# Patient Record
Sex: Female | Born: 1960 | Race: White | Hispanic: No | Marital: Married
Health system: Southern US, Community
[De-identification: ages and names within clinical notes are randomized; demographics above are authoritative.]

## PROBLEM LIST (undated history)

## (undated) DIAGNOSIS — E119 Type 2 diabetes mellitus without complications: Secondary | ICD-10-CM

## (undated) DIAGNOSIS — E079 Disorder of thyroid, unspecified: Secondary | ICD-10-CM

## (undated) DIAGNOSIS — K219 Gastro-esophageal reflux disease without esophagitis: Secondary | ICD-10-CM

---

## 2019-02-02 ENCOUNTER — Emergency Department (HOSPITAL_COMMUNITY): Payer: BC Managed Care – PPO

## 2019-02-02 ENCOUNTER — Other Ambulatory Visit: Payer: Self-pay

## 2019-02-02 ENCOUNTER — Encounter (HOSPITAL_COMMUNITY): Payer: Self-pay | Admitting: Emergency Medicine

## 2019-02-02 ENCOUNTER — Inpatient Hospital Stay (HOSPITAL_COMMUNITY)
Admission: EM | Admit: 2019-02-02 | Discharge: 2019-02-28 | DRG: 025 | Disposition: E | Payer: BC Managed Care – PPO | Attending: Emergency Medicine | Admitting: Emergency Medicine

## 2019-02-02 DIAGNOSIS — E875 Hyperkalemia: Secondary | ICD-10-CM | POA: Diagnosis not present

## 2019-02-02 DIAGNOSIS — Z781 Physical restraint status: Secondary | ICD-10-CM

## 2019-02-02 DIAGNOSIS — K760 Fatty (change of) liver, not elsewhere classified: Secondary | ICD-10-CM | POA: Diagnosis present

## 2019-02-02 DIAGNOSIS — Z978 Presence of other specified devices: Secondary | ICD-10-CM

## 2019-02-02 DIAGNOSIS — R17 Unspecified jaundice: Secondary | ICD-10-CM

## 2019-02-02 DIAGNOSIS — J189 Pneumonia, unspecified organism: Secondary | ICD-10-CM | POA: Diagnosis present

## 2019-02-02 DIAGNOSIS — R402362 Coma scale, best motor response, obeys commands, at arrival to emergency department: Secondary | ICD-10-CM | POA: Diagnosis present

## 2019-02-02 DIAGNOSIS — E872 Acidosis: Secondary | ICD-10-CM | POA: Diagnosis present

## 2019-02-02 DIAGNOSIS — N390 Urinary tract infection, site not specified: Secondary | ICD-10-CM | POA: Diagnosis not present

## 2019-02-02 DIAGNOSIS — E8770 Fluid overload, unspecified: Secondary | ICD-10-CM | POA: Diagnosis present

## 2019-02-02 DIAGNOSIS — R410 Disorientation, unspecified: Secondary | ICD-10-CM | POA: Diagnosis not present

## 2019-02-02 DIAGNOSIS — Z452 Encounter for adjustment and management of vascular access device: Secondary | ICD-10-CM

## 2019-02-02 DIAGNOSIS — D6959 Other secondary thrombocytopenia: Secondary | ICD-10-CM | POA: Diagnosis present

## 2019-02-02 DIAGNOSIS — E876 Hypokalemia: Secondary | ICD-10-CM | POA: Diagnosis present

## 2019-02-02 DIAGNOSIS — J9600 Acute respiratory failure, unspecified whether with hypoxia or hypercapnia: Secondary | ICD-10-CM

## 2019-02-02 DIAGNOSIS — Z66 Do not resuscitate: Secondary | ICD-10-CM | POA: Diagnosis not present

## 2019-02-02 DIAGNOSIS — T82594A Other mechanical complication of infusion catheter, initial encounter: Secondary | ICD-10-CM

## 2019-02-02 DIAGNOSIS — E871 Hypo-osmolality and hyponatremia: Secondary | ICD-10-CM

## 2019-02-02 DIAGNOSIS — S065X0A Traumatic subdural hemorrhage without loss of consciousness, initial encounter: Principal | ICD-10-CM | POA: Diagnosis present

## 2019-02-02 DIAGNOSIS — A419 Sepsis, unspecified organism: Secondary | ICD-10-CM | POA: Diagnosis not present

## 2019-02-02 DIAGNOSIS — Z9889 Other specified postprocedural states: Secondary | ICD-10-CM

## 2019-02-02 DIAGNOSIS — E039 Hypothyroidism, unspecified: Secondary | ICD-10-CM | POA: Diagnosis present

## 2019-02-02 DIAGNOSIS — Y92009 Unspecified place in unspecified non-institutional (private) residence as the place of occurrence of the external cause: Secondary | ICD-10-CM

## 2019-02-02 DIAGNOSIS — R4182 Altered mental status, unspecified: Secondary | ICD-10-CM | POA: Diagnosis not present

## 2019-02-02 DIAGNOSIS — E861 Hypovolemia: Secondary | ICD-10-CM | POA: Diagnosis not present

## 2019-02-02 DIAGNOSIS — B965 Pseudomonas (aeruginosa) (mallei) (pseudomallei) as the cause of diseases classified elsewhere: Secondary | ICD-10-CM | POA: Diagnosis not present

## 2019-02-02 DIAGNOSIS — L89152 Pressure ulcer of sacral region, stage 2: Secondary | ICD-10-CM | POA: Diagnosis present

## 2019-02-02 DIAGNOSIS — N179 Acute kidney failure, unspecified: Secondary | ICD-10-CM | POA: Diagnosis not present

## 2019-02-02 DIAGNOSIS — R748 Abnormal levels of other serum enzymes: Secondary | ICD-10-CM

## 2019-02-02 DIAGNOSIS — G9341 Metabolic encephalopathy: Secondary | ICD-10-CM | POA: Diagnosis present

## 2019-02-02 DIAGNOSIS — K219 Gastro-esophageal reflux disease without esophagitis: Secondary | ICD-10-CM | POA: Diagnosis present

## 2019-02-02 DIAGNOSIS — K7200 Acute and subacute hepatic failure without coma: Secondary | ICD-10-CM

## 2019-02-02 DIAGNOSIS — J9601 Acute respiratory failure with hypoxia: Secondary | ICD-10-CM

## 2019-02-02 DIAGNOSIS — D6489 Other specified anemias: Secondary | ICD-10-CM | POA: Diagnosis present

## 2019-02-02 DIAGNOSIS — G8191 Hemiplegia, unspecified affecting right dominant side: Secondary | ICD-10-CM | POA: Diagnosis present

## 2019-02-02 DIAGNOSIS — R4701 Aphasia: Secondary | ICD-10-CM | POA: Diagnosis present

## 2019-02-02 DIAGNOSIS — S065X9A Traumatic subdural hemorrhage with loss of consciousness of unspecified duration, initial encounter: Secondary | ICD-10-CM | POA: Diagnosis not present

## 2019-02-02 DIAGNOSIS — G9340 Encephalopathy, unspecified: Secondary | ICD-10-CM | POA: Diagnosis present

## 2019-02-02 DIAGNOSIS — E11649 Type 2 diabetes mellitus with hypoglycemia without coma: Secondary | ICD-10-CM | POA: Diagnosis not present

## 2019-02-02 DIAGNOSIS — D689 Coagulation defect, unspecified: Secondary | ICD-10-CM | POA: Diagnosis present

## 2019-02-02 DIAGNOSIS — K831 Obstruction of bile duct: Secondary | ICD-10-CM | POA: Diagnosis present

## 2019-02-02 DIAGNOSIS — E119 Type 2 diabetes mellitus without complications: Secondary | ICD-10-CM

## 2019-02-02 DIAGNOSIS — R6521 Severe sepsis with septic shock: Secondary | ICD-10-CM | POA: Diagnosis not present

## 2019-02-02 DIAGNOSIS — R296 Repeated falls: Secondary | ICD-10-CM | POA: Diagnosis present

## 2019-02-02 DIAGNOSIS — D696 Thrombocytopenia, unspecified: Secondary | ICD-10-CM | POA: Diagnosis not present

## 2019-02-02 DIAGNOSIS — S065XAA Traumatic subdural hemorrhage with loss of consciousness status unknown, initial encounter: Secondary | ICD-10-CM

## 2019-02-02 DIAGNOSIS — R402242 Coma scale, best verbal response, confused conversation, at arrival to emergency department: Secondary | ICD-10-CM | POA: Diagnosis present

## 2019-02-02 DIAGNOSIS — Z20828 Contact with and (suspected) exposure to other viral communicable diseases: Secondary | ICD-10-CM | POA: Diagnosis present

## 2019-02-02 DIAGNOSIS — R402142 Coma scale, eyes open, spontaneous, at arrival to emergency department: Secondary | ICD-10-CM | POA: Diagnosis present

## 2019-02-02 DIAGNOSIS — N170 Acute kidney failure with tubular necrosis: Secondary | ICD-10-CM | POA: Diagnosis present

## 2019-02-02 DIAGNOSIS — Z6836 Body mass index (BMI) 36.0-36.9, adult: Secondary | ICD-10-CM

## 2019-02-02 DIAGNOSIS — W19XXXA Unspecified fall, initial encounter: Secondary | ICD-10-CM | POA: Diagnosis present

## 2019-02-02 HISTORY — DX: Type 2 diabetes mellitus without complications: E11.9

## 2019-02-02 HISTORY — DX: Disorder of thyroid, unspecified: E07.9

## 2019-02-02 HISTORY — DX: Gastro-esophageal reflux disease without esophagitis: K21.9

## 2019-02-02 LAB — COMPREHENSIVE METABOLIC PANEL
ALT: 56 U/L — ABNORMAL HIGH (ref 0–44)
AST: 173 U/L — ABNORMAL HIGH (ref 15–41)
Albumin: 2.2 g/dL — ABNORMAL LOW (ref 3.5–5.0)
Alkaline Phosphatase: 241 U/L — ABNORMAL HIGH (ref 38–126)
Anion gap: 14 (ref 5–15)
BUN: 13 mg/dL (ref 6–20)
CO2: 23 mmol/L (ref 22–32)
Calcium: 8.4 mg/dL — ABNORMAL LOW (ref 8.9–10.3)
Chloride: 79 mmol/L — ABNORMAL LOW (ref 98–111)
Creatinine, Ser: 1.26 mg/dL — ABNORMAL HIGH (ref 0.44–1.00)
GFR calc Af Amer: 56 mL/min — ABNORMAL LOW (ref >60)
GFR calc non Af Amer: 48 mL/min — ABNORMAL LOW (ref >60)
Glucose, Bld: 115 mg/dL — ABNORMAL HIGH (ref 70–99)
Potassium: 3.3 mmol/L — ABNORMAL LOW (ref 3.5–5.1)
Sodium: 116 mmol/L — CL (ref 135–145)
Total Bilirubin: 31.4 mg/dL (ref 0.3–1.2)
Total Protein: 5.6 g/dL — ABNORMAL LOW (ref 6.5–8.1)

## 2019-02-02 LAB — PROTIME-INR
INR: 1.4 — ABNORMAL HIGH (ref 0.8–1.2)
Prothrombin Time: 16.7 seconds — ABNORMAL HIGH (ref 11.4–15.2)

## 2019-02-02 LAB — POCT I-STAT EG7
Acid-base deficit: 1 mmol/L (ref 0.0–2.0)
Bicarbonate: 23.4 mmol/L (ref 20.0–28.0)
Calcium, Ion: 1 mmol/L — ABNORMAL LOW (ref 1.15–1.40)
HCT: 53 % — ABNORMAL HIGH (ref 36.0–46.0)
Hemoglobin: 18 g/dL — ABNORMAL HIGH (ref 12.0–15.0)
O2 Saturation: 62 %
Potassium: 3.3 mmol/L — ABNORMAL LOW (ref 3.5–5.1)
Sodium: 115 mmol/L — CL (ref 135–145)
TCO2: 24 mmol/L (ref 22–32)
pCO2, Ven: 37.1 mmHg — ABNORMAL LOW (ref 44.0–60.0)
pH, Ven: 7.407 (ref 7.250–7.430)
pO2, Ven: 32 mmHg (ref 32.0–45.0)

## 2019-02-02 LAB — RAPID URINE DRUG SCREEN, HOSP PERFORMED
Amphetamines: NOT DETECTED
Barbiturates: NOT DETECTED
Benzodiazepines: NOT DETECTED
Cocaine: NOT DETECTED
Opiates: NOT DETECTED
Tetrahydrocannabinol: NOT DETECTED

## 2019-02-02 LAB — URINALYSIS, ROUTINE W REFLEX MICROSCOPIC
Cellular Cast, UA: 14
Glucose, UA: 50 mg/dL — AB
Ketones, ur: 5 mg/dL — AB
Leukocytes,Ua: NEGATIVE
Nitrite: NEGATIVE
Protein, ur: 30 mg/dL — AB
Specific Gravity, Urine: 1.025 (ref 1.005–1.030)
pH: 5 (ref 5.0–8.0)

## 2019-02-02 LAB — LACTIC ACID, PLASMA
Lactic Acid, Venous: 3 mmol/L (ref 0.5–1.9)
Lactic Acid, Venous: 1.8 mmol/L (ref 0.5–1.9)

## 2019-02-02 LAB — CBC WITH DIFFERENTIAL/PLATELET
Abs Immature Granulocytes: 0.2 10*3/uL — ABNORMAL HIGH (ref 0.00–0.07)
Basophils Absolute: 0 10*3/uL (ref 0.0–0.1)
Basophils Relative: 0 %
Eosinophils Absolute: 0 10*3/uL (ref 0.0–0.5)
Eosinophils Relative: 0 %
HCT: 31.2 % — ABNORMAL LOW (ref 36.0–46.0)
Hemoglobin: 11.1 g/dL — ABNORMAL LOW (ref 12.0–15.0)
Lymphocytes Relative: 6 %
Lymphs Abs: 0.7 10*3/uL (ref 0.7–4.0)
MCH: 33.2 pg (ref 26.0–34.0)
MCHC: 35.6 g/dL (ref 30.0–36.0)
MCV: 93.4 fL (ref 80.0–100.0)
Monocytes Absolute: 0.9 10*3/uL (ref 0.1–1.0)
Monocytes Relative: 8 %
Neutro Abs: 9.4 10*3/uL — ABNORMAL HIGH (ref 1.7–7.7)
Neutrophils Relative %: 84 %
Platelets: 188 10*3/uL (ref 150–400)
Promyelocytes Relative: 2 %
RBC: 3.34 MIL/uL — ABNORMAL LOW (ref 3.87–5.11)
RDW: 14.1 % (ref 11.5–15.5)
WBC: 11.2 10*3/uL — ABNORMAL HIGH (ref 4.0–10.5)
nRBC: 1.2 % — ABNORMAL HIGH (ref 0.0–0.2)
nRBC: 3 /100 WBC — ABNORMAL HIGH

## 2019-02-02 LAB — TYPE AND SCREEN
ABO/RH(D): O POS
Antibody Screen: NEGATIVE

## 2019-02-02 LAB — BILIRUBIN, DIRECT: Bilirubin, Direct: 19.6 mg/dL — ABNORMAL HIGH (ref 0.0–0.2)

## 2019-02-02 LAB — ACETAMINOPHEN LEVEL: Acetaminophen (Tylenol), Serum: <10 ug/mL — ABNORMAL LOW (ref 10–30)

## 2019-02-02 LAB — LIPASE, BLOOD: Lipase: 46 U/L (ref 11–51)

## 2019-02-02 LAB — ETHANOL: Alcohol, Ethyl (B): <10 mg/dL (ref <10)

## 2019-02-02 LAB — TROPONIN I: Troponin I: 0.06 ng/mL (ref <0.03)

## 2019-02-02 LAB — SARS CORONAVIRUS 2 BY RT PCR (HOSPITAL ORDER, PERFORMED IN ~~LOC~~ HOSPITAL LAB): SARS Coronavirus 2: NEGATIVE

## 2019-02-02 LAB — AMMONIA: Ammonia: 68 umol/L — ABNORMAL HIGH (ref 9–35)

## 2019-02-02 MED ORDER — IOHEXOL 300 MG/ML  SOLN
100.0000 mL | Freq: Once | INTRAMUSCULAR | Status: AC | PRN
  Administered 2019-02-02: 100 mL via INTRAVENOUS

## 2019-02-02 MED ORDER — NYSTATIN 100000 UNIT/GM EX POWD
Freq: Three times a day (TID) | CUTANEOUS | Status: DC
  Administered 2019-02-02 – 2019-02-03 (×2): via TOPICAL
  Administered 2019-02-03: 21:00:00 1 g via TOPICAL
  Administered 2019-02-04: 16:00:00 via TOPICAL
  Administered 2019-02-04: 10:00:00 1 via TOPICAL
  Administered 2019-02-04 – 2019-02-13 (×27): via TOPICAL
  Administered 2019-02-13: 1 g via TOPICAL
  Administered 2019-02-14 (×2): via TOPICAL
  Administered 2019-02-14: 21:00:00 1 g via TOPICAL
  Administered 2019-02-15: 09:00:00 via TOPICAL
  Filled 2019-02-02 (×5): qty 15

## 2019-02-02 NOTE — ED Notes (Signed)
[facility] transport line contacted for transport. Will return call when room number has been assigned. Dr.Carson accepting provider, awaiting bed on MICU

## 2019-02-02 NOTE — ED Notes (Signed)
CT contacted for discs for transport

## 2019-02-02 NOTE — ED Triage Notes (Signed)
Patient in via [facility] from home for altered mental status that has progressed since mechanical fall at about 2pm today. Patient completed a telehealth assessment online and went to an imaging center for a back x-ray. Per EMS, patient's daughter had told them she was only c/o R ankle pain. On arrival, patient severely jaundiced head to toe - spoke with daughter, who stated that her skin has been yellow for only 1.5-2 weeks. She also reported to doctor at bedside that patient has been limited to bed for the last 2-3 months and has had episodes of vomiting and abd pain. Patient A&O x 2, answers some questions appropriately but does not talk in complete sentences and appears very lethargic. Resp e/u.   Multiple areas of skin breakdown noted underneath both breasts, posterior legs, and underneath abdomen.

## 2019-02-02 NOTE — Consult Note (Signed)
Reason for Consult: Left subdural hematoma Referring Physician: EDP  Lavina HammanDee Taylor Robinson is an 58 y.o. female.   HPI:  58 year old female who was seen in emergency department with altered mental status and obvious jaundice.  She is unable to cooperate with history and physical.  Very little information is known.  The EDP did speak to her daughter and it appears she has a fairly benign past medical history.  It is unknown how long she has been like this.  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 Diabetes mellitus without complication (HCC)     History reviewed. No pertinent surgical history.  No Known Allergies  Social History   Tobacco Use  . Smoking status: Not on file  Substance Use Topics  . Alcohol use: Not on file    History reviewed. No pertinent family history.   Review of Systems  Positive ROS: Unable to obtain  All other systems have been reviewed and were otherwise negative with the exception of those mentioned in the HPI and as above.  Objective: Vital signs in last 24 hours: Temp:  [98.1 F (36.7 C)] 98.1 F (36.7 C) (06/05 1740) Pulse Rate:  [101-108] 101 (06/05 1900) Resp:  [14-21] 21 (06/05 1900) BP: (121-139)/(52-63) 121/58 (06/05 1900) SpO2:  [95 %-96 %] 95 % (06/05 1900)  General Appearance: Arousable, will some words, weakly localizes on the left but seems somewhat weaker on the right (E3, V2, M5) Head: Normocephalic, without obvious abnormality, atraumatic Eyes: PERRL, conjunctiva/corneas icteric, gaze conjugate Ears: Normal TM's and external ear canals, both ears Throat: benign, facial mask in place Neck: Supple Lungs:  respirations unlabored Heart: Regular rate and rhythm Abdomen: Soft Extremities: Extremities normal, atraumatic, no cyanosis or  edema Pulses: 2+ and symmetric all extremities Skin: Skin color is quite jaundiced, multiple areas of redness and skin breakdown in the folds of her groins and under her breasts  NEUROLOGIC:   Mental status: Resting comfortably but arouses to stimuli and sometimes to voice, localizes briskly on the left but seems somewhat right hemiparetic, not follow commands but mumbles words, suspect she is aphasic Motor Exam -localizes and purposeful on the left and somewhat hemiparetic on the right, normal tone and bulk Sensory Exam -unable to test Reflexes: Not tested Coordination -unable to test Gait -unable to test Balance -unable to test Cranial Nerves: I: smell Not tested  II: visual acuity  OS: na    OD: na  II: visual fields   II: pupils perrl  III,VII: ptosis absent  III,IV,VI: extraocular muscles    V: mastication   V: facial light touch sensation    V,VII: corneal reflex  present  VII: facial muscle function - upper    VII: facial muscle function - lower   VIII: hearing   IX: soft palate elevation    IX,X: gag reflex   XI: trapezius strength    XI: sternocleidomastoid strength   XI: neck flexion strength    XII: tongue strength      Data Review Lab Results  Component Value Date   WBC 11.2 (H) Jan 08, 2019   HGB 18.0 (H) Jan 08, 2019   HCT 53.0 (H) Jan 08, 2019   MCV 93.4 Jan 08, 2019   PLT 188 Jan 08, 2019   Lab Results  Component Value Date   NA 115 (LL) Jan 08, 2019  K 3.3 (L) 2019/02/27   CL 79 (L) February 27, 2019   CO2 23 02/27/19   BUN 13 02/27/19   CREATININE 1.26 (H) 02-27-19   GLUCOSE 115 (H) Feb 27, 2019   Lab Results  Component Value Date   INR 1.4 (H) February 27, 2019    Radiology: Ct Head Wo Contrast  Result Date: 2019-02-27 CLINICAL DATA:  Altered level of consciousness. EXAM: CT HEAD WITHOUT CONTRAST TECHNIQUE: Contiguous axial images were obtained from the base of the skull through the vertex without intravenous contrast. COMPARISON:  None. FINDINGS: Brain: There  is a large acute left-sided subdural hematoma along the left frontoparietal convexity. It measures approximately 2.6 cm in its greatest dimension on the coronal view. There is effacement of the left lateral ventricle with a 6 mm rightward midline shift. Vascular: No hyperdense vessel or unexpected calcification. Skull: Normal. Negative for fracture or focal lesion. Sinuses/Orbits: No acute finding. The patient is status post bilateral cataract surgery. Other: None. IMPRESSION: Large subdural hematoma along the left frontoparietal convexity measuring 2.6 cm in width and causing a 6 mm rightward midline shift. These results were called by telephone at the time of interpretation on 02/27/19 at 6:53 pm to Dr. Frederick Peers , who verbally acknowledged these results. Electronically Signed   By: Katherine Mantle M.D.   On: 02/27/19 18:58   Dg Chest Port 1 View  Result Date: 02-27-2019 CLINICAL DATA:  Shortness of breath EXAM: PORTABLE CHEST 1 VIEW COMPARISON:  None. FINDINGS: There is focal airspace opacity in the medial right base. There is mild atelectatic change in the left base. Lungs elsewhere are clear. Heart is upper normal in size with pulmonary vascularity normal. No adenopathy. No bone lesions. IMPRESSION: Focal consolidation medial right base concerning for pneumonia. Left base atelectasis. Lungs elsewhere clear. Heart upper normal in size. Followup PA and lateral chest radiographs recommended in 3-4 weeks following trial of antibiotic therapy to ensure resolution and exclude underlying malignancy. Electronically Signed   By: Bretta Bang III M.D.   On: 02/27/19 18:44     Assessment/Plan: There is no height or weight on file to calculate BMI.  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  Spoken with the cardiologist/critical care medicine physician at length they have great concerns about her having surgery here tonight.  They prefer referral to a tertiary care center with a transplant team.  I do not believe she needs emergent surgery right now as this does not appear to be life-threatening at this time.  Her medical issues however do appear to be much more concerning.  The emergency physician and the medical team feel that the risk of surgery is likely higher than the risk of waiting at this point.  While this is a large hematoma, it is acute on chronic and likely surgery can wait until she reaches the tertiary care center and is medically "tuned up" and deemed safe for such surgery.  I believe she is safe for transfer.  if for some reason she is not transferred we are certainly here and will take care of the subdural hematoma when okayed by her medical team (I suspect by tomorrow).  Please call us if she remains at [facility].  Our number was given directly to the nurse practitioner.   Tia Alert 2019-02-27 8:36 PM

## 2019-02-02 NOTE — ED Notes (Signed)
Patient transported to CT 

## 2019-02-02 NOTE — ED Notes (Signed)
Neurosurgery and CCM at bedside

## 2019-02-02 NOTE — ED Notes (Signed)
Patient's daughter, Liana Gerold, may be reached at [phone].

## 2019-02-02 NOTE — ED Provider Notes (Signed)
[facility] EMERGENCY DEPARTMENT Provider Note   CSN: [id number] Arrival date & time: 02/07/2019  1737    History   Chief Complaint Chief Complaint  Patient presents with   Altered Mental Status    HPI Taylor Robinson is a 58 y.o. female.     58 year old female with past medical history including thyroid problems, GERD, type 2 diabetes mellitus who presents with fall and altered mental status.  History obtained primarily from daughter over the phone and EMS.  EMS states that the patient had a fall earlier today and had a telemedicine visit due to back pain.  She had x-rays that were negative.  Daughter called EMS for worsening lethargy today.  EMS notes that she has been confused in transport.  Daughter notes that she has had jaundice for the past 1 to 2 weeks.  She has had a few months of laying around in bed a lot due to problems with eating/swallowing.  She states that the patient often starts coughing and then vomiting when she tries to eat anything.  She has reported abdominal pain only from straining with multiple episodes of vomiting.  Daughter is not aware of any fevers.  No recent travel, alcohol abuse, or history of IV drug use.  Daughter denies any history of liver problems.  LEVEL 5 CAVEAT DUE TO AMS  The history is provided by the patient and a relative. The history is limited by the condition of the patient.  Altered Mental Status    Past Medical History:  Diagnosis Date   Diabetes mellitus without complication ([place])     There are no active problems to display for this patient.   History reviewed. No pertinent surgical history.   OB History   No obstetric history on file.      Home Medications    Prior to Admission medications   Not on File    Family History History reviewed. No pertinent family history.  Social History Social History   Tobacco Use   Smoking status: Not on file  Substance Use Topics   Alcohol use: Not on file    Drug use: Not on file     Allergies   Patient has no known allergies.   Review of Systems Review of Systems  Unable to perform ROS: Mental status change     Physical Exam Updated Vital Signs BP (!) 170/68    Pulse (!) 104    Temp 98.2 F (36.8 C) (Oral)    Resp (!) 25    SpO2 93%   Physical Exam Vitals signs and nursing note reviewed.  Constitutional:      General: She is not in acute distress.    Appearance: She is well-developed. She is obese. She is ill-appearing.  HENT:     Head: Normocephalic and atraumatic.     Mouth/Throat:     Comments: Dry mouth Eyes:     General: Scleral icterus present.     Conjunctiva/sclera: Conjunctivae normal.     Pupils: Pupils are equal, round, and reactive to light.  Neck:     Musculoskeletal: Neck supple.  Cardiovascular:     Rate and Rhythm: Regular rhythm. Tachycardia present.     Heart sounds: Murmur present.  Pulmonary:     Effort: Pulmonary effort is normal.     Comments: Diminished BS b/l Abdominal:     General: Bowel sounds are normal. There is no distension.     Palpations: Abdomen is soft.  Tenderness: There is no abdominal tenderness.  Musculoskeletal:     Right lower leg: Edema present.     Left lower leg: Edema present.  Skin:    General: Skin is warm and dry.     Coloration: Skin is jaundiced.     Findings: Bruising and rash present.     Comments: Multiple bruises of varying ages scattered on trunk and extremities; scattered purpura on breasts; erythema and skin break down in skin folds under breasts and panus  Neurological:     Mental Status: She is alert.     Comments: Disoriented, unintelligible answers to questions      ED Treatments / Results  Labs (all labs ordered are listed, but only abnormal results are displayed) Labs Reviewed  COMPREHENSIVE METABOLIC PANEL - Abnormal; Notable for the following components:      Result Value   Sodium 116 (*)    Potassium 3.3 (*)    Chloride 79 (*)     Glucose, Bld 115 (*)    Creatinine, Ser 1.26 (*)    Calcium 8.4 (*)    Total Protein 5.6 (*)    Albumin 2.2 (*)    AST 173 (*)    ALT 56 (*)    Alkaline Phosphatase 241 (*)    Total Bilirubin 31.4 (*)    GFR calc non Af Amer 48 (*)    GFR calc Af Amer 56 (*)    All other components within normal limits  ACETAMINOPHEN LEVEL - Abnormal; Notable for the following components:   Acetaminophen (Tylenol), Serum <10 (*)    All other components within normal limits  LACTIC ACID, PLASMA - Abnormal; Notable for the following components:   Lactic Acid, Venous 3.0 (*)    All other components within normal limits  TROPONIN I - Abnormal; Notable for the following components:   Troponin I 0.06 (*)    All other components within normal limits  CBC WITH DIFFERENTIAL/PLATELET - Abnormal; Notable for the following components:   WBC 11.2 (*)    RBC 3.34 (*)    Hemoglobin 11.1 (*)    HCT 31.2 (*)    nRBC 1.2 (*)    Neutro Abs 9.4 (*)    nRBC 3 (*)    Abs Immature Granulocytes 0.20 (*)    All other components within normal limits  PROTIME-INR - Abnormal; Notable for the following components:   Prothrombin Time 16.7 (*)    INR 1.4 (*)    All other components within normal limits  AMMONIA - Abnormal; Notable for the following components:   Ammonia 68 (*)    All other components within normal limits  URINALYSIS, ROUTINE W REFLEX MICROSCOPIC - Abnormal; Notable for the following components:   Color, Urine AMBER (*)    APPearance HAZY (*)    Glucose, UA 50 (*)    Hgb urine dipstick SMALL (*)    Bilirubin Urine MODERATE (*)    Ketones, ur 5 (*)    Protein, ur 30 (*)    Bacteria, UA MANY (*)    Non Squamous Epithelial 6-10 (*)    All other components within normal limits  BILIRUBIN, DIRECT - Abnormal; Notable for the following components:   Bilirubin, Direct 19.6 (*)    All other components within normal limits  POCT I-STAT EG7 - Abnormal; Notable for the following components:   pCO2, Ven 37.1  (*)    Sodium 115 (*)    Potassium 3.3 (*)    Calcium, Ion 1.00 (*)  HCT 53.0 (*)    Hemoglobin 18.0 (*)    All other components within normal limits  SARS CORONAVIRUS 2 (HOSPITAL ORDER, PERFORMED IN [facility])  URINE CULTURE  CULTURE, BLOOD (ROUTINE X 2)  CULTURE, BLOOD (ROUTINE X 2)  ETHANOL  LACTIC ACID, PLASMA  RAPID URINE DRUG SCREEN, HOSP PERFORMED  LIPASE, BLOOD  HEPATITIS PANEL, ACUTE  PATHOLOGIST SMEAR REVIEW  I-STAT VENOUS BLOOD GAS, ED  TYPE AND SCREEN  ABO/RH    EKG EKG Interpretation  Date/Time:  Friday February 02 2019 17:41:03 EDT Ventricular Rate:  106 PR Interval:    QRS Duration: 92 QT Interval:  345 QTC Calculation: 459 R Axis:   12 Text Interpretation:  Sinus tachycardia Low voltage, precordial leads No previous ECGs available Confirmed by Theotis Burrow [phone]) on 02/07/2019 5:50:11 PM   Radiology Ct Head Wo Contrast  Result Date: 02/04/2019 CLINICAL DATA:  Altered level of consciousness. EXAM: CT HEAD WITHOUT CONTRAST TECHNIQUE: Contiguous axial images were obtained from the base of the skull through the vertex without intravenous contrast. COMPARISON:  None. FINDINGS: Brain: There is a large acute left-sided subdural hematoma along the left frontoparietal convexity. It measures approximately 2.6 cm in its greatest dimension on the coronal view. There is effacement of the left lateral ventricle with a 6 mm rightward midline shift. Vascular: No hyperdense vessel or unexpected calcification. Skull: Normal. Negative for fracture or focal lesion. Sinuses/Orbits: No acute finding. The patient is status post bilateral cataract surgery. Other: None. IMPRESSION: Large subdural hematoma along the left frontoparietal convexity measuring 2.6 cm in width and causing a 6 mm rightward midline shift. These results were called by telephone at the time of interpretation on 02/11/2019 at 6:53 pm to Dr. Theotis Burrow , who verbally acknowledged these results.  Electronically Signed   By: Constance Holster M.D.   On: 02/08/2019 18:58   Ct Chest W Contrast  Result Date: 02/20/2019 CLINICAL DATA:  58 year old female with fall and abdominal pain. History of liver failure. EXAM: CT CHEST, ABDOMEN, AND PELVIS WITH CONTRAST TECHNIQUE: Multidetector CT imaging of the chest, abdomen and pelvis was performed following the standard protocol during bolus administration of intravenous contrast. CONTRAST:  136m OMNIPAQUE IOHEXOL 300 MG/ML  SOLN COMPARISON:  None. FINDINGS: CT CHEST FINDINGS Cardiovascular: There is no cardiomegaly or pericardial effusion. The thoracic aorta is unremarkable. The origins of the great vessels of the arch appear patent as visualized. The central pulmonary arteries are unremarkable. Mediastinum/Nodes: There is no hilar or mediastinal adenopathy. Esophagus and thyroid gland are grossly unremarkable. No mediastinal fluid collection. Lungs/Pleura: The lungs are clear. There is no pleural effusion or pneumothorax. The central airways are patent. Musculoskeletal: Mild age indeterminate, likely chronic compression deformity of the superior endplates of T2 and T3. No definite acute fracture. CT ABDOMEN PELVIS FINDINGS No intra-abdominal free air or free fluid. Hepatobiliary: Severe fatty infiltration of the liver. No intrahepatic biliary ductal dilatation. Probable sludge within the gallbladder. No calcified stone. No pericholecystic fluid. Pancreas: Unremarkable. No pancreatic ductal dilatation or surrounding inflammatory changes. Spleen: Normal in size without focal abnormality. Adrenals/Urinary Tract: The adrenal glands, kidneys, visualized ureters, and urinary bladder appear unremarkable. Stomach/Bowel: There is mild thickened appearance of the ascending colon and hepatic flexure, likely artifactual. Mild colitis is not excluded. Clinical correlation is recommended. There is no bowel obstruction. The appendix is normal. Vascular/Lymphatic: The abdominal  aorta and IVC appear unremarkable. No portal venous gas. There is no adenopathy. Reproductive: Hysterectomy. No pelvic mass. Other: Diffuse  subcutaneous edema. No fluid collection. Musculoskeletal: No acute or significant osseous findings. IMPRESSION: 1. No acute intrathoracic pathology. 2. Underdistention of the ascending colon versus less likely mild colitis. Clinical correlation is recommended. No bowel obstruction. Normal appendix. 3. Severe fatty infiltration of the liver. Electronically Signed   By: Anner Crete M.D.   On: 02/05/2019 22:20   Ct Abdomen Pelvis W Contrast  Result Date: 02/12/2019 CLINICAL DATA:  58 year old female with fall and abdominal pain. History of liver failure. EXAM: CT CHEST, ABDOMEN, AND PELVIS WITH CONTRAST TECHNIQUE: Multidetector CT imaging of the chest, abdomen and pelvis was performed following the standard protocol during bolus administration of intravenous contrast. CONTRAST:  14m OMNIPAQUE IOHEXOL 300 MG/ML  SOLN COMPARISON:  None. FINDINGS: CT CHEST FINDINGS Cardiovascular: There is no cardiomegaly or pericardial effusion. The thoracic aorta is unremarkable. The origins of the great vessels of the arch appear patent as visualized. The central pulmonary arteries are unremarkable. Mediastinum/Nodes: There is no hilar or mediastinal adenopathy. Esophagus and thyroid gland are grossly unremarkable. No mediastinal fluid collection. Lungs/Pleura: The lungs are clear. There is no pleural effusion or pneumothorax. The central airways are patent. Musculoskeletal: Mild age indeterminate, likely chronic compression deformity of the superior endplates of T2 and T3. No definite acute fracture. CT ABDOMEN PELVIS FINDINGS No intra-abdominal free air or free fluid. Hepatobiliary: Severe fatty infiltration of the liver. No intrahepatic biliary ductal dilatation. Probable sludge within the gallbladder. No calcified stone. No pericholecystic fluid. Pancreas: Unremarkable. No pancreatic  ductal dilatation or surrounding inflammatory changes. Spleen: Normal in size without focal abnormality. Adrenals/Urinary Tract: The adrenal glands, kidneys, visualized ureters, and urinary bladder appear unremarkable. Stomach/Bowel: There is mild thickened appearance of the ascending colon and hepatic flexure, likely artifactual. Mild colitis is not excluded. Clinical correlation is recommended. There is no bowel obstruction. The appendix is normal. Vascular/Lymphatic: The abdominal aorta and IVC appear unremarkable. No portal venous gas. There is no adenopathy. Reproductive: Hysterectomy. No pelvic mass. Other: Diffuse subcutaneous edema. No fluid collection. Musculoskeletal: No acute or significant osseous findings. IMPRESSION: 1. No acute intrathoracic pathology. 2. Underdistention of the ascending colon versus less likely mild colitis. Clinical correlation is recommended. No bowel obstruction. Normal appendix. 3. Severe fatty infiltration of the liver. Electronically Signed   By: AAnner CreteM.D.   On: 02/05/2019 22:20   Dg Chest Port 1 View  Result Date: 02/25/2019 CLINICAL DATA:  Shortness of breath EXAM: PORTABLE CHEST 1 VIEW COMPARISON:  None. FINDINGS: There is focal airspace opacity in the medial right base. There is mild atelectatic change in the left base. Lungs elsewhere are clear. Heart is upper normal in size with pulmonary vascularity normal. No adenopathy. No bone lesions. IMPRESSION: Focal consolidation medial right base concerning for pneumonia. Left base atelectasis. Lungs elsewhere clear. Heart upper normal in size. Followup PA and lateral chest radiographs recommended in 3-4 weeks following trial of antibiotic therapy to ensure resolution and exclude underlying malignancy. Electronically Signed   By: WLowella GripIII M.D.   On: 01/29/2019 18:44    Procedures .Critical Care Performed by: LSharlett Iles MD Authorized by: LSharlett Iles MD   Critical care  provider statement:    Critical care time (minutes):  75   Critical care time was exclusive of:  Separately billable procedures and treating other patients   Critical care was necessary to treat or prevent imminent or life-threatening deterioration of the following conditions:  CNS failure or compromise, hepatic failure and metabolic crisis   Critical  care was time spent personally by me on the following activities:  Development of treatment plan with patient or surrogate, evaluation of patient's response to treatment, examination of patient, discussions with consultants, obtaining history from patient or surrogate, ordering and performing treatments and interventions, ordering and review of laboratory studies, ordering and review of radiographic studies and re-evaluation of patient's condition   (including critical care time)  Medications Ordered in ED Medications  nystatin (MYCOSTATIN/NYSTOP) topical powder ( Topical Not Given 02/11/2019 2246)  iohexol (OMNIPAQUE) 300 MG/ML solution 100 mL (100 mLs Intravenous Contrast Given 02/20/2019 2141)     Initial Impression / Assessment and Plan / ED Course  I have reviewed the triage vital signs and the nursing notes.  Pertinent labs & imaging results that were available during my care of the patient were reviewed by me and considered in my medical decision making (see chart for details).       Ill appearing on exam, afebrile, mild tachycardia but otherwise stable VS. differential is broad and includes liver failure due to acute viral process, NASH, neoplasm. Obtained cultures and broad labwork.    WBC 11.2, Hgb 11.1, PLT 188, sodium 116, potassium 3.3, chloride 79, creatinine 1.26, albumin 2.2, AST 173, ALT 56, alk phos 241, total bilirubin 31.4, initial lactate 3, troponin 0 0.06, direct bilirubin 19.6.  INR 1.4.  The etiology of her liver injury is unclear, does not appear to be in fulminant liver failure as bilirubin is disproportionately high  compared to INR, creatinine.   Obtained head CT which is notable for a large subdural hematoma in frontoparietal lobe measuring 2.6 cm with 6 mm midline shift. CT chest though pelvis shows only severe fatty liver.  Discussed with neurosurgery, Dr. Ronnald Ramp, who evaluated the patient and noted that she has mixed acute and subacute components and will likely require future evacuation, although difficult situation given metabolic derangements.  Discussed with MICU team here who ultimately felt that patient may benefit from transfer to a higher level of care where she can be evaluated by liver transplant service.  Contacted the patient's daughter regarding plan and she agreed with transfer.  Discussed with [facility]'s and transplant specialist on-call, Dr. Drue Novel. Discussed w/ Dr. March Rummage, NSGY, and Dr. Joellyn Rued, MICU, who have all agreed to evaluate the patient in transfer. She will be admitted to [facility]. Currently VS stable and pt's mental status has remained the same. She will be transported to [facility] for further w/u and treatment.  Final Clinical Impressions(s) / ED Diagnoses   Final diagnoses:  Altered mental status, unspecified altered mental status type  Hyponatremia  Subdural hematoma ([place])  Hyperbilirubinemia    ED Discharge Orders    None       Lehman Whiteley, Wenda Overland, MD 01/31/2019 2340

## 2019-02-02 NOTE — Consult Note (Addendum)
NAME:  Taylor Robinson, MRN:  [id number], DOB:  [birth year], LOS: 0 ADMISSION DATE:  02/24/2019, CONSULTATION DATE:  02/22/2019 REFERRING MD:  Dr. 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  Neurosurgery does not feel that emergent surgery is warranted at this time.  PCCM consulted for admit but with recommendations for early transfer to transplant center.   History of present illness   HPI obtained from medical chart review as patient is encephalopathic.   58 year old female with history of obesity, thyroid problems, GERD, and DMT2 presenting from home with worsening jaundice over last 1-2 weeks, falls, and increasing confusion/ lethargy today.   Daughter had provided some history in ER.  Reportedly been more sedentary recently with problems of coughing spells leading into vomiting when attempting to eat with associated abdominal pain patient related to multiple episodes of vomiting.  No reported fevers, alcohol abuse, or known history of liver problems.    In ER, afebrile, normotensive and normal oxygen saturations but remains altered but protecting her airway.  Workup noted for Na 116, K 3.3, CL 79, glucose 115, sCr 1.26, alk phos 241, AST 173, ALT 56, t. Bili 31.4, direct bili 19.6, INR 1.4, ammonia 68,  WBC 11.2, Hgb 11.1, Platelets 188, tylenol level neg, Lactic 3 -> 1.8, UA and UDS pending VBG 7.407/ 37/ 32/ 23.4, CXR showing RLL opacity and left lower atelectasis.  CT head showed acute on chronic large left frontoparietal SDH with 6 mm rightward midline shift.  CT chest and abd/ pelvis ordered but not performed yet.  Neurosurgery consulted.  PCCM consulted for admit.   Past Medical History  Thyroid problems, GERD, DMT2  Significant Hospital Events    Consults:  NSGY  Procedures:   Significant Diagnostic Tests:  6/5 CTH >> Large subdural hematoma along the left frontoparietal convexity measuring 2.6 cm in width and causing a 6 mm rightward midline shift.  6/5 CT abd/pelvis 6/5  CT chest   Micro Data:  6/5 SARS coronavirus 2 >> neg 6/5 BCx 2 >> 6/5 UC >>  Antimicrobials:   Interim history/subjective:   Objective   Blood pressure (!) 109/32, pulse (!) 107, temperature 98.1 F (36.7 C), temperature source Oral, resp. rate (!) 22, SpO2 95 %.       No intake or output data in the 24 hours ending 02/12/2019 2104 There were no vitals filed for this visit.  Examination: General:  Obese - chronically/ acutely ill appearing female  HEENT: MM pink/dry, pupils 3/reactive, icterus, face mask in place Neuro:  Little incomprehensible speech, did squeeze left hand moving BUE, seems weaker on right, spont moving LLE/ minimal in RLE CV: RR, ST, no mumur PULM: even/non-labored, lungs bilaterally clear anteriorly, diminished in bases GI: soft, non tender on exam, hypoBS Extremities: warm/dry, 2-3 + LE edema  Skin: severe jaundice, multiple abrasions/ scabs noted to legs, knees, arms, excoriations to abd, and multiple areas of skin breakdown in her folds    left groin   Right groin   Right breast fold   Resolved Hospital Problem list    Assessment & Plan:   Acute on chronic  SDH with 6 mm rightward midline shift Acute liver injury - INR remains 1.4, Meld score 25 w/ 19.6% 3 month mortality rate, tylenol neg AKI RLL inflitrate  Multiple skin areas of breakdown  DMT2 P:  NSGY evaluated- surgery not emergent at this time Currently protecting airway Pending CT abd/ pelvis  UDS pending Pending lipase and viral hepatitis panel.  May consider autoimmune workup if etiology remains unclear    Recommendations for early transfer to transplant center pending further evaluation and workup.  Spoke with Dr. Rex Kras in ER who states patient has been accepted to [facility] pending transfer.  If transfer falls through, PCCM will gladly admit to ICU.  Best practice:   Labs   CBC: Recent Labs  Lab 01/30/2019 1749 01/30/2019 1807  WBC 11.2*  --   NEUTROABS 9.4*  --   HGB 11.1* 18.0*  HCT 31.2* 53.0*  MCV 93.4  --   PLT 188  --     Basic Metabolic Panel: Recent Labs  Lab 02/01/2019 1749 02/01/2019 1807  NA 116* 115*  K 3.3* 3.3*  CL 79*  --   CO2 23  --   GLUCOSE 115*  --   BUN 13  --   CREATININE 1.26*  --   CALCIUM 8.4*  --    GFR: CrCl cannot be calculated (Unknown ideal weight.). Recent Labs  Lab 02/08/2019 1749 01/30/2019 2010  WBC 11.2*  --   LATICACIDVEN 3.0* 1.8    Liver Function Tests: Recent Labs  Lab 02/24/2019 1749  AST 173*  ALT 56*  ALKPHOS 241*  BILITOT 31.4*  PROT 5.6*  ALBUMIN 2.2*   No results for input(s): LIPASE, AMYLASE in the last 168 hours. Recent Labs  Lab 02/25/2019 1749  AMMONIA 68*    ABG    Component Value Date/Time   HCO3 23.4 02/12/2019 1807   TCO2 24 02/04/2019 1807   ACIDBASEDEF 1.0 02/01/2019 1807   O2SAT 62.0 02/05/2019 1807     Coagulation Profile: Recent Labs  Lab 02/23/2019 1749  INR 1.4*    Cardiac Enzymes: Recent Labs  Lab 02/17/2019 1749  TROPONINI 0.06*    HbA1C: No results found for: HGBA1C  CBG: No results for input(s): GLUCAP in the last 168 hours.  Review of Systems:   unable  Past Medical History  She,  has a past medical history of Diabetes mellitus without complication ([place]).   Surgical History   History reviewed. No pertinent surgical history.   Social History      Family History   Her family history is not on file.   Allergies No Known Allergies   Home Medications  Prior to Admission medications   Not on File     Critical care time: 30 mins    Kennieth Rad, MSN, AGACNP-BC  [facility] Pgr: [phone] or if no answer [phone] 02/09/2019, 10:50 PM

## 2019-02-03 ENCOUNTER — Emergency Department (HOSPITAL_COMMUNITY): Payer: BC Managed Care – PPO

## 2019-02-03 ENCOUNTER — Emergency Department (HOSPITAL_COMMUNITY): Payer: BC Managed Care – PPO | Admitting: Certified Registered"

## 2019-02-03 ENCOUNTER — Inpatient Hospital Stay (HOSPITAL_COMMUNITY): Payer: BC Managed Care – PPO

## 2019-02-03 ENCOUNTER — Encounter (HOSPITAL_COMMUNITY): Admission: EM | Disposition: E | Payer: Self-pay | Source: Home / Self Care | Attending: Emergency Medicine

## 2019-02-03 DIAGNOSIS — E871 Hypo-osmolality and hyponatremia: Secondary | ICD-10-CM | POA: Diagnosis present

## 2019-02-03 DIAGNOSIS — Z9911 Dependence on respirator [ventilator] status: Secondary | ICD-10-CM

## 2019-02-03 DIAGNOSIS — A419 Sepsis, unspecified organism: Secondary | ICD-10-CM | POA: Diagnosis not present

## 2019-02-03 DIAGNOSIS — R402142 Coma scale, eyes open, spontaneous, at arrival to emergency department: Secondary | ICD-10-CM | POA: Diagnosis present

## 2019-02-03 DIAGNOSIS — W19XXXA Unspecified fall, initial encounter: Secondary | ICD-10-CM | POA: Diagnosis present

## 2019-02-03 DIAGNOSIS — Z9889 Other specified postprocedural states: Secondary | ICD-10-CM | POA: Diagnosis not present

## 2019-02-03 DIAGNOSIS — E8770 Fluid overload, unspecified: Secondary | ICD-10-CM | POA: Diagnosis present

## 2019-02-03 DIAGNOSIS — G9341 Metabolic encephalopathy: Secondary | ICD-10-CM | POA: Diagnosis present

## 2019-02-03 DIAGNOSIS — K7201 Acute and subacute hepatic failure with coma: Secondary | ICD-10-CM | POA: Diagnosis not present

## 2019-02-03 DIAGNOSIS — D649 Anemia, unspecified: Secondary | ICD-10-CM | POA: Diagnosis not present

## 2019-02-03 DIAGNOSIS — R4701 Aphasia: Secondary | ICD-10-CM | POA: Diagnosis present

## 2019-02-03 DIAGNOSIS — S065X0A Traumatic subdural hemorrhage without loss of consciousness, initial encounter: Secondary | ICD-10-CM | POA: Diagnosis present

## 2019-02-03 DIAGNOSIS — R17 Unspecified jaundice: Secondary | ICD-10-CM

## 2019-02-03 DIAGNOSIS — S065X9A Traumatic subdural hemorrhage with loss of consciousness of unspecified duration, initial encounter: Secondary | ICD-10-CM | POA: Diagnosis present

## 2019-02-03 DIAGNOSIS — D689 Coagulation defect, unspecified: Secondary | ICD-10-CM | POA: Diagnosis present

## 2019-02-03 DIAGNOSIS — E872 Acidosis: Secondary | ICD-10-CM | POA: Diagnosis present

## 2019-02-03 DIAGNOSIS — J189 Pneumonia, unspecified organism: Secondary | ICD-10-CM | POA: Diagnosis present

## 2019-02-03 DIAGNOSIS — Y92009 Unspecified place in unspecified non-institutional (private) residence as the place of occurrence of the external cause: Secondary | ICD-10-CM | POA: Diagnosis not present

## 2019-02-03 DIAGNOSIS — R6521 Severe sepsis with septic shock: Secondary | ICD-10-CM | POA: Diagnosis not present

## 2019-02-03 DIAGNOSIS — J9601 Acute respiratory failure with hypoxia: Secondary | ICD-10-CM

## 2019-02-03 DIAGNOSIS — Z66 Do not resuscitate: Secondary | ICD-10-CM | POA: Diagnosis not present

## 2019-02-03 DIAGNOSIS — D696 Thrombocytopenia, unspecified: Secondary | ICD-10-CM | POA: Diagnosis not present

## 2019-02-03 DIAGNOSIS — G8191 Hemiplegia, unspecified affecting right dominant side: Secondary | ICD-10-CM | POA: Diagnosis present

## 2019-02-03 DIAGNOSIS — Z20828 Contact with and (suspected) exposure to other viral communicable diseases: Secondary | ICD-10-CM | POA: Diagnosis present

## 2019-02-03 DIAGNOSIS — S065XAA Traumatic subdural hemorrhage with loss of consciousness status unknown, initial encounter: Secondary | ICD-10-CM | POA: Diagnosis present

## 2019-02-03 DIAGNOSIS — D6959 Other secondary thrombocytopenia: Secondary | ICD-10-CM | POA: Diagnosis present

## 2019-02-03 DIAGNOSIS — N390 Urinary tract infection, site not specified: Secondary | ICD-10-CM | POA: Diagnosis not present

## 2019-02-03 DIAGNOSIS — E11649 Type 2 diabetes mellitus with hypoglycemia without coma: Secondary | ICD-10-CM | POA: Diagnosis not present

## 2019-02-03 DIAGNOSIS — G9340 Encephalopathy, unspecified: Secondary | ICD-10-CM | POA: Diagnosis not present

## 2019-02-03 DIAGNOSIS — R402362 Coma scale, best motor response, obeys commands, at arrival to emergency department: Secondary | ICD-10-CM | POA: Diagnosis present

## 2019-02-03 DIAGNOSIS — N170 Acute kidney failure with tubular necrosis: Secondary | ICD-10-CM | POA: Diagnosis present

## 2019-02-03 DIAGNOSIS — R402242 Coma scale, best verbal response, confused conversation, at arrival to emergency department: Secondary | ICD-10-CM | POA: Diagnosis present

## 2019-02-03 DIAGNOSIS — J9600 Acute respiratory failure, unspecified whether with hypoxia or hypercapnia: Secondary | ICD-10-CM | POA: Diagnosis not present

## 2019-02-03 DIAGNOSIS — N179 Acute kidney failure, unspecified: Secondary | ICD-10-CM | POA: Diagnosis not present

## 2019-02-03 DIAGNOSIS — K760 Fatty (change of) liver, not elsewhere classified: Secondary | ICD-10-CM | POA: Diagnosis present

## 2019-02-03 DIAGNOSIS — K7200 Acute and subacute hepatic failure without coma: Secondary | ICD-10-CM | POA: Diagnosis present

## 2019-02-03 DIAGNOSIS — K831 Obstruction of bile duct: Secondary | ICD-10-CM | POA: Diagnosis present

## 2019-02-03 DIAGNOSIS — R40244 Other coma, without documented Glasgow coma scale score, or with partial score reported, unspecified time: Secondary | ICD-10-CM | POA: Diagnosis not present

## 2019-02-03 HISTORY — PX: CRANIOTOMY: SHX93

## 2019-02-03 LAB — POCT I-STAT 7, (LYTES, BLD GAS, ICA,H+H)
Bicarbonate: 23.6 mmol/L (ref 20.0–28.0)
Calcium, Ion: 0.99 mmol/L — ABNORMAL LOW (ref 1.15–1.40)
HCT: 31 % — ABNORMAL LOW (ref 36.0–46.0)
Hemoglobin: 10.5 g/dL — ABNORMAL LOW (ref 12.0–15.0)
O2 Saturation: 100 %
Potassium: 3.4 mmol/L — ABNORMAL LOW (ref 3.5–5.1)
Sodium: 117 mmol/L — CL (ref 135–145)
TCO2: 25 mmol/L (ref 22–32)
pCO2 arterial: 32.5 mmHg (ref 32.0–48.0)
pH, Arterial: 7.469 — ABNORMAL HIGH (ref 7.350–7.450)
pO2, Arterial: 289 mmHg — ABNORMAL HIGH (ref 83.0–108.0)

## 2019-02-03 LAB — COMPREHENSIVE METABOLIC PANEL
ALT: 51 U/L — ABNORMAL HIGH (ref 0–44)
ALT: 41 U/L (ref 0–44)
ALT: 39 U/L (ref 0–44)
AST: 163 U/L — ABNORMAL HIGH (ref 15–41)
AST: 136 U/L — ABNORMAL HIGH (ref 15–41)
AST: 136 U/L — ABNORMAL HIGH (ref 15–41)
Albumin: 2 g/dL — ABNORMAL LOW (ref 3.5–5.0)
Albumin: 2.2 g/dL — ABNORMAL LOW (ref 3.5–5.0)
Albumin: 2 g/dL — ABNORMAL LOW (ref 3.5–5.0)
Alkaline Phosphatase: 234 U/L — ABNORMAL HIGH (ref 38–126)
Alkaline Phosphatase: 202 U/L — ABNORMAL HIGH (ref 38–126)
Alkaline Phosphatase: 204 U/L — ABNORMAL HIGH (ref 38–126)
Anion gap: 17 — ABNORMAL HIGH (ref 5–15)
Anion gap: 16 — ABNORMAL HIGH (ref 5–15)
Anion gap: 12 (ref 5–15)
BUN: 17 mg/dL (ref 6–20)
BUN: 17 mg/dL (ref 6–20)
BUN: 17 mg/dL (ref 6–20)
CO2: 21 mmol/L — ABNORMAL LOW (ref 22–32)
CO2: 21 mmol/L — ABNORMAL LOW (ref 22–32)
CO2: 22 mmol/L (ref 22–32)
Calcium: 8.3 mg/dL — ABNORMAL LOW (ref 8.9–10.3)
Calcium: 7.9 mg/dL — ABNORMAL LOW (ref 8.9–10.3)
Calcium: 7.6 mg/dL — ABNORMAL LOW (ref 8.9–10.3)
Chloride: 80 mmol/L — ABNORMAL LOW (ref 98–111)
Chloride: 82 mmol/L — ABNORMAL LOW (ref 98–111)
Chloride: 83 mmol/L — ABNORMAL LOW (ref 98–111)
Creatinine, Ser: 1.47 mg/dL — ABNORMAL HIGH (ref 0.44–1.00)
Creatinine, Ser: 1.33 mg/dL — ABNORMAL HIGH (ref 0.44–1.00)
Creatinine, Ser: 1.34 mg/dL — ABNORMAL HIGH (ref 0.44–1.00)
GFR calc Af Amer: 46 mL/min — ABNORMAL LOW (ref >60)
GFR calc Af Amer: 52 mL/min — ABNORMAL LOW (ref >60)
GFR calc Af Amer: 52 mL/min — ABNORMAL LOW (ref >60)
GFR calc non Af Amer: 40 mL/min — ABNORMAL LOW (ref >60)
GFR calc non Af Amer: 45 mL/min — ABNORMAL LOW (ref >60)
GFR calc non Af Amer: 44 mL/min — ABNORMAL LOW (ref >60)
Glucose, Bld: 109 mg/dL — ABNORMAL HIGH (ref 70–99)
Glucose, Bld: 115 mg/dL — ABNORMAL HIGH (ref 70–99)
Glucose, Bld: 133 mg/dL — ABNORMAL HIGH (ref 70–99)
Potassium: 3.5 mmol/L (ref 3.5–5.1)
Potassium: 3 mmol/L — ABNORMAL LOW (ref 3.5–5.1)
Potassium: 3.2 mmol/L — ABNORMAL LOW (ref 3.5–5.1)
Sodium: 118 mmol/L — CL (ref 135–145)
Sodium: 119 mmol/L — CL (ref 135–145)
Sodium: 117 mmol/L — CL (ref 135–145)
Total Bilirubin: 28.7 mg/dL (ref 0.3–1.2)
Total Bilirubin: 26.6 mg/dL (ref 0.3–1.2)
Total Bilirubin: 28 mg/dL (ref 0.3–1.2)
Total Protein: 5.4 g/dL — ABNORMAL LOW (ref 6.5–8.1)
Total Protein: 5.5 g/dL — ABNORMAL LOW (ref 6.5–8.1)
Total Protein: 5.3 g/dL — ABNORMAL LOW (ref 6.5–8.1)

## 2019-02-03 LAB — CBC WITH DIFFERENTIAL/PLATELET
Band Neutrophils: 2 %
Basophils Absolute: 0.2 10*3/uL — ABNORMAL HIGH (ref 0.0–0.1)
Basophils Relative: 2 %
Blasts: 0 %
Eosinophils Absolute: 0 10*3/uL (ref 0.0–0.5)
Eosinophils Relative: 0 %
HCT: 24.6 % — ABNORMAL LOW (ref 36.0–46.0)
Hemoglobin: 8.8 g/dL — ABNORMAL LOW (ref 12.0–15.0)
Lymphocytes Relative: 9 %
Lymphs Abs: 0.8 10*3/uL (ref 0.7–4.0)
MCH: 33.2 pg (ref 26.0–34.0)
MCHC: 35.8 g/dL (ref 30.0–36.0)
MCV: 92.8 fL (ref 80.0–100.0)
Metamyelocytes Relative: 0 %
Monocytes Absolute: 0.7 10*3/uL (ref 0.1–1.0)
Monocytes Relative: 8 %
Myelocytes: 0 %
Neutro Abs: 7 10*3/uL (ref 1.7–7.7)
Neutrophils Relative %: 79 %
Other: 0 %
Platelets: 165 10*3/uL (ref 150–400)
Promyelocytes Relative: 0 %
RBC: 2.65 MIL/uL — ABNORMAL LOW (ref 3.87–5.11)
RDW: 14 % (ref 11.5–15.5)
WBC: 8.7 10*3/uL (ref 4.0–10.5)
nRBC: 0.9 % — ABNORMAL HIGH (ref 0.0–0.2)
nRBC: 1 /100 WBC — ABNORMAL HIGH

## 2019-02-03 LAB — ACETAMINOPHEN LEVEL: Acetaminophen (Tylenol), Serum: <10 ug/mL — ABNORMAL LOW (ref 10–30)

## 2019-02-03 LAB — AMMONIA: Ammonia: 121 umol/L — ABNORMAL HIGH (ref 9–35)

## 2019-02-03 LAB — PHOSPHORUS: Phosphorus: 3.3 mg/dL (ref 2.5–4.6)

## 2019-02-03 LAB — ABO/RH: ABO/RH(D): O POS

## 2019-02-03 LAB — PROTIME-INR
INR: 1.4 — ABNORMAL HIGH (ref 0.8–1.2)
INR: 1.4 — ABNORMAL HIGH (ref 0.8–1.2)
Prothrombin Time: 17.3 seconds — ABNORMAL HIGH (ref 11.4–15.2)
Prothrombin Time: 17.2 seconds — ABNORMAL HIGH (ref 11.4–15.2)

## 2019-02-03 LAB — GLUCOSE, CAPILLARY
Glucose-Capillary: 108 mg/dL — ABNORMAL HIGH (ref 70–99)
Glucose-Capillary: 124 mg/dL — ABNORMAL HIGH (ref 70–99)
Glucose-Capillary: 141 mg/dL — ABNORMAL HIGH (ref 70–99)

## 2019-02-03 LAB — FIBRINOGEN: Fibrinogen: 362 mg/dL (ref 210–475)

## 2019-02-03 LAB — HEPATITIS PANEL, ACUTE
HCV Ab: <0.1 s/co ratio (ref 0.0–0.9)
Hep A IgM: NEGATIVE
Hep B C IgM: NEGATIVE
Hepatitis B Surface Ag: NEGATIVE

## 2019-02-03 LAB — MRSA PCR SCREENING: MRSA by PCR: NEGATIVE

## 2019-02-03 LAB — MAGNESIUM: Magnesium: 2 mg/dL (ref 1.7–2.4)

## 2019-02-03 SURGERY — CRANIOTOMY HEMATOMA EVACUATION SUBDURAL
Anesthesia: General | Site: Head | Laterality: Left

## 2019-02-03 MED ORDER — LIDOCAINE-EPINEPHRINE 1 %-1:100000 IJ SOLN
INTRAMUSCULAR | Status: DC | PRN
  Administered 2019-02-03: 10 mL via INTRADERMAL

## 2019-02-03 MED ORDER — BACITRACIN ZINC 500 UNIT/GM EX OINT
TOPICAL_OINTMENT | CUTANEOUS | Status: AC
  Filled 2019-02-03: qty 28.35

## 2019-02-03 MED ORDER — CHLORHEXIDINE GLUCONATE CLOTH 2 % EX PADS
6.0000 | MEDICATED_PAD | Freq: Every day | CUTANEOUS | Status: DC
  Administered 2019-02-03 – 2019-02-04 (×2): 6 via TOPICAL

## 2019-02-03 MED ORDER — DEXAMETHASONE SODIUM PHOSPHATE 4 MG/ML IJ SOLN
4.0000 mg | Freq: Three times a day (TID) | INTRAMUSCULAR | Status: DC
  Administered 2019-02-05 – 2019-02-08 (×9): 4 mg via INTRAVENOUS
  Filled 2019-02-03 (×9): qty 1

## 2019-02-03 MED ORDER — DEXAMETHASONE SODIUM PHOSPHATE 4 MG/ML IJ SOLN
4.0000 mg | Freq: Four times a day (QID) | INTRAMUSCULAR | Status: AC
  Administered 2019-02-04 – 2019-02-05 (×4): 4 mg via INTRAVENOUS
  Filled 2019-02-03 (×4): qty 1

## 2019-02-03 MED ORDER — PANTOPRAZOLE SODIUM 40 MG IV SOLR
40.0000 mg | Freq: Every day | INTRAVENOUS | Status: DC
  Administered 2019-02-03 – 2019-02-04 (×2): 40 mg via INTRAVENOUS
  Filled 2019-02-03 (×2): qty 40

## 2019-02-03 MED ORDER — PROPOFOL 10 MG/ML IV BOLUS
INTRAVENOUS | Status: DC | PRN
  Administered 2019-02-03: 40 mg via INTRAVENOUS

## 2019-02-03 MED ORDER — SODIUM CHLORIDE 0.9 % IV SOLN
INTRAVENOUS | Status: DC | PRN
  Administered 2019-02-03: 10:00:00 20 ug/min via INTRAVENOUS

## 2019-02-03 MED ORDER — CHLORHEXIDINE GLUCONATE 0.12% ORAL RINSE (MEDLINE KIT)
15.0000 mL | Freq: Two times a day (BID) | OROMUCOSAL | Status: DC
  Administered 2019-02-03 – 2019-02-15 (×24): 15 mL via OROMUCOSAL

## 2019-02-03 MED ORDER — THROMBIN 20000 UNITS EX SOLR
CUTANEOUS | Status: AC
  Filled 2019-02-03: qty 20000

## 2019-02-03 MED ORDER — BACITRACIN ZINC 500 UNIT/GM EX OINT
TOPICAL_OINTMENT | CUTANEOUS | Status: DC | PRN
  Administered 2019-02-03: 1 via TOPICAL

## 2019-02-03 MED ORDER — ONDANSETRON HCL 4 MG/2ML IJ SOLN: 4.0000 mg | INTRAMUSCULAR | Status: DC | PRN

## 2019-02-03 MED ORDER — FENTANYL CITRATE (PF) 100 MCG/2ML IJ SOLN
50.0000 ug | INTRAMUSCULAR | Status: DC | PRN
  Administered 2019-02-04 – 2019-02-05 (×8): 100 ug via INTRAVENOUS
  Filled 2019-02-03 (×8): qty 2

## 2019-02-03 MED ORDER — THROMBIN 5000 UNITS EX SOLR
CUTANEOUS | Status: AC
  Filled 2019-02-03: qty 5000

## 2019-02-03 MED ORDER — PROPOFOL 1000 MG/100ML IV EMUL
INTRAVENOUS | Status: AC
  Administered 2019-02-03: 08:00:00 10 ug/kg/min
  Filled 2019-02-03: qty 100

## 2019-02-03 MED ORDER — CEFAZOLIN SODIUM-DEXTROSE 2-3 GM-%(50ML) IV SOLR
INTRAVENOUS | Status: DC | PRN
  Administered 2019-02-03: 2 g via INTRAVENOUS

## 2019-02-03 MED ORDER — THROMBIN 20000 UNITS EX SOLR
CUTANEOUS | Status: DC | PRN
  Administered 2019-02-03: 20 mL via TOPICAL

## 2019-02-03 MED ORDER — THROMBIN 5000 UNITS EX SOLR
OROMUCOSAL | Status: DC | PRN
  Administered 2019-02-03: 5 mL via TOPICAL

## 2019-02-03 MED ORDER — PROPOFOL 500 MG/50ML IV EMUL
INTRAVENOUS | Status: DC | PRN
  Administered 2019-02-03: 40 ug/kg/min via INTRAVENOUS

## 2019-02-03 MED ORDER — LIDOCAINE-EPINEPHRINE 1 %-1:100000 IJ SOLN
INTRAMUSCULAR | Status: AC
  Filled 2019-02-03: qty 1

## 2019-02-03 MED ORDER — POTASSIUM CHLORIDE IN NACL 20-0.9 MEQ/L-% IV SOLN
INTRAVENOUS | Status: DC
  Administered 2019-02-03 – 2019-02-04 (×2): via INTRAVENOUS
  Administered 2019-02-04: 1000 mL via INTRAVENOUS
  Administered 2019-02-05 – 2019-02-07 (×4): via INTRAVENOUS
  Filled 2019-02-03 (×7): qty 1000

## 2019-02-03 MED ORDER — SODIUM CHLORIDE 0.9 % IV SOLN
INTRAVENOUS | Status: DC | PRN
  Administered 2019-02-03: 10:00:00 via INTRAVENOUS

## 2019-02-03 MED ORDER — LEVETIRACETAM IN NACL 500 MG/100ML IV SOLN
500.0000 mg | Freq: Two times a day (BID) | INTRAVENOUS | Status: DC
  Administered 2019-02-03 – 2019-02-08 (×10): 500 mg via INTRAVENOUS
  Filled 2019-02-03 (×11): qty 100

## 2019-02-03 MED ORDER — VITAMIN K1 10 MG/ML IJ SOLN
5.0000 mg | Freq: Every day | INTRAVENOUS | Status: AC
  Administered 2019-02-03 – 2019-02-05 (×3): 5 mg via INTRAVENOUS
  Filled 2019-02-03 (×3): qty 0.5

## 2019-02-03 MED ORDER — LACTULOSE 10 GM/15ML PO SOLN
20.0000 g | Freq: Three times a day (TID) | ORAL | Status: DC
  Administered 2019-02-03 – 2019-02-08 (×15): 20 g
  Filled 2019-02-03 (×15): qty 30

## 2019-02-03 MED ORDER — LACTULOSE 10 GM/15ML PO SOLN: 20.0000 g | Freq: Three times a day (TID) | ORAL | Status: DC

## 2019-02-03 MED ORDER — 0.9 % SODIUM CHLORIDE (POUR BTL) OPTIME
TOPICAL | Status: DC | PRN
  Administered 2019-02-03 (×2): 1000 mL

## 2019-02-03 MED ORDER — FENTANYL CITRATE (PF) 250 MCG/5ML IJ SOLN
INTRAMUSCULAR | Status: DC | PRN
  Administered 2019-02-03: 100 ug via INTRAVENOUS
  Administered 2019-02-03: 50 ug via INTRAVENOUS

## 2019-02-03 MED ORDER — ETOMIDATE 2 MG/ML IV SOLN
INTRAVENOUS | Status: AC | PRN
  Administered 2019-02-03: 20 mg via INTRAVENOUS

## 2019-02-03 MED ORDER — FENTANYL CITRATE (PF) 100 MCG/2ML IJ SOLN: 50.0000 ug | INTRAMUSCULAR | Status: DC | PRN

## 2019-02-03 MED ORDER — PROPOFOL 1000 MG/100ML IV EMUL
0.0000 ug/kg/min | INTRAVENOUS | Status: DC
  Administered 2019-02-04: 20 ug/kg/min via INTRAVENOUS
  Filled 2019-02-03 (×2): qty 100

## 2019-02-03 MED ORDER — CEFAZOLIN SODIUM-DEXTROSE 1-4 GM/50ML-% IV SOLN
1.0000 g | Freq: Three times a day (TID) | INTRAVENOUS | Status: AC
  Administered 2019-02-03 – 2019-02-04 (×2): 1 g via INTRAVENOUS
  Filled 2019-02-03 (×2): qty 50

## 2019-02-03 MED ORDER — ROCURONIUM BROMIDE 10 MG/ML (PF) SYRINGE
PREFILLED_SYRINGE | INTRAVENOUS | Status: DC | PRN
  Administered 2019-02-03: 70 mg via INTRAVENOUS

## 2019-02-03 MED ORDER — LABETALOL HCL 5 MG/ML IV SOLN
10.0000 mg | INTRAVENOUS | Status: DC | PRN
  Administered 2019-02-03 – 2019-02-08 (×4): 20 mg via INTRAVENOUS
  Filled 2019-02-03 (×4): qty 4

## 2019-02-03 MED ORDER — DEXAMETHASONE SODIUM PHOSPHATE 10 MG/ML IJ SOLN
6.0000 mg | Freq: Four times a day (QID) | INTRAMUSCULAR | Status: AC
  Administered 2019-02-03 – 2019-02-04 (×4): 6 mg via INTRAVENOUS
  Filled 2019-02-03 (×4): qty 1

## 2019-02-03 MED ORDER — MORPHINE SULFATE (PF) 2 MG/ML IV SOLN: 1.0000 mg | INTRAVENOUS | Status: DC | PRN

## 2019-02-03 MED ORDER — ROCURONIUM BROMIDE 50 MG/5ML IV SOLN
INTRAVENOUS | Status: AC | PRN
  Administered 2019-02-03: 80 mg via INTRAVENOUS

## 2019-02-03 MED ORDER — MIDAZOLAM HCL 2 MG/2ML IJ SOLN
INTRAMUSCULAR | Status: AC
  Filled 2019-02-03: qty 2

## 2019-02-03 MED ORDER — ONDANSETRON HCL 4 MG PO TABS: 4.0000 mg | ORAL_TABLET | ORAL | Status: DC | PRN

## 2019-02-03 MED ORDER — FENTANYL CITRATE (PF) 250 MCG/5ML IJ SOLN
INTRAMUSCULAR | Status: AC
  Filled 2019-02-03: qty 5

## 2019-02-03 MED ORDER — PROMETHAZINE HCL 25 MG PO TABS: 12.5000 mg | ORAL_TABLET | ORAL | Status: DC | PRN

## 2019-02-03 MED ORDER — ORAL CARE MOUTH RINSE
15.0000 mL | OROMUCOSAL | Status: DC
  Administered 2019-02-03 – 2019-02-15 (×115): 15 mL via OROMUCOSAL

## 2019-02-03 SURGICAL SUPPLY — 65 items
BAG DECANTER FOR FLEXI CONT (MISCELLANEOUS) ×3 IMPLANT
BATTERY IQ STERILE (MISCELLANEOUS) ×3 IMPLANT
BUR ACORN 9.0 PRECISION (BURR) ×2 IMPLANT
BUR ACORN 9.0MM PRECISION (BURR) ×1
BUR SPIRAL ROUTER 2.3 (BUR) ×2 IMPLANT
BUR SPIRAL ROUTER 2.3MM (BUR) ×1
CANISTER SUCT 3000ML PPV (MISCELLANEOUS) ×3 IMPLANT
CARTRIDGE OIL MAESTRO DRILL (MISCELLANEOUS) ×1 IMPLANT
CLIP VESOCCLUDE MED 6/CT (CLIP) IMPLANT
COVER WAND RF STERILE (DRAPES) ×3 IMPLANT
DIFFUSER DRILL AIR PNEUMATIC (MISCELLANEOUS) ×3 IMPLANT
DRAPE MICROSCOPE LEICA (MISCELLANEOUS) IMPLANT
DRAPE NEUROLOGICAL W/INCISE (DRAPES) ×3 IMPLANT
DRAPE SURG 17X23 STRL (DRAPES) IMPLANT
DRAPE WARM FLUID 44X44 (DRAPES) ×3 IMPLANT
DRSG OPSITE POSTOP 4X8 (GAUZE/BANDAGES/DRESSINGS) ×3 IMPLANT
DURAPREP 6ML APPLICATOR 50/CS (WOUND CARE) ×3 IMPLANT
ELECT CAUTERY BLADE 6.4 (BLADE) ×3 IMPLANT
ELECT REM PT RETURN 9FT ADLT (ELECTROSURGICAL) ×3
ELECTRODE REM PT RTRN 9FT ADLT (ELECTROSURGICAL) ×1 IMPLANT
EVACUATOR 1/8 PVC DRAIN (DRAIN) IMPLANT
GAUZE 4X4 16PLY RFD (DISPOSABLE) IMPLANT
GAUZE SPONGE 4X4 12PLY STRL (GAUZE/BANDAGES/DRESSINGS) ×3 IMPLANT
GLOVE BIO SURGEON STRL SZ7 (GLOVE) ×6 IMPLANT
GLOVE BIO SURGEON STRL SZ8 (GLOVE) ×3 IMPLANT
GLOVE BIOGEL PI IND STRL 7.0 (GLOVE) IMPLANT
GLOVE BIOGEL PI IND STRL 7.5 (GLOVE) ×1 IMPLANT
GLOVE BIOGEL PI INDICATOR 7.0 (GLOVE)
GLOVE BIOGEL PI INDICATOR 7.5 (GLOVE) ×2
GOWN STRL REUS W/ TWL LRG LVL3 (GOWN DISPOSABLE) IMPLANT
GOWN STRL REUS W/ TWL XL LVL3 (GOWN DISPOSABLE) IMPLANT
GOWN STRL REUS W/TWL 2XL LVL3 (GOWN DISPOSABLE) ×3 IMPLANT
GOWN STRL REUS W/TWL LRG LVL3 (GOWN DISPOSABLE)
GOWN STRL REUS W/TWL XL LVL3 (GOWN DISPOSABLE)
HEMOSTAT POWDER KIT SURGIFOAM (HEMOSTASIS) IMPLANT
KIT BASIN OR (CUSTOM PROCEDURE TRAY) ×3 IMPLANT
KIT TURNOVER KIT B (KITS) ×3 IMPLANT
NEEDLE HYPO 22GX1.5 SAFETY (NEEDLE) ×3 IMPLANT
NS IRRIG 1000ML POUR BTL (IV SOLUTION) ×3 IMPLANT
OIL CARTRIDGE MAESTRO DRILL (MISCELLANEOUS) ×3
PACK CRANIOTOMY CUSTOM (CUSTOM PROCEDURE TRAY) ×3 IMPLANT
PAD ARMBOARD 7.5X6 YLW CONV (MISCELLANEOUS) ×3 IMPLANT
PATTIES SURGICAL .25X.25 (GAUZE/BANDAGES/DRESSINGS) IMPLANT
PATTIES SURGICAL .5 X.5 (GAUZE/BANDAGES/DRESSINGS) IMPLANT
PATTIES SURGICAL .5 X3 (DISPOSABLE) IMPLANT
PATTIES SURGICAL 1X1 (DISPOSABLE) IMPLANT
PERFORATOR LRG  14-11MM (BIT) ×2
PERFORATOR LRG 14-11MM (BIT) ×1 IMPLANT
PIN MAYFIELD SKULL DISP (PIN) IMPLANT
PLATE 1.5  2HOLE LNG NEURO (Plate) ×8 IMPLANT
PLATE 1.5 2HOLE LNG NEURO (Plate) ×4 IMPLANT
RUBBERBAND STERILE (MISCELLANEOUS) IMPLANT
SCREW SELF DRILL HT 1.5/4MM (Screw) ×24 IMPLANT
SPONGE NEURO XRAY DETECT 1X3 (DISPOSABLE) IMPLANT
SPONGE SURGIFOAM ABS GEL 100 (HEMOSTASIS) ×3 IMPLANT
STAPLER VISISTAT 35W (STAPLE) ×3 IMPLANT
SUT ETHILON 3 0 FSL (SUTURE) IMPLANT
SUT NURALON 4 0 TR CR/8 (SUTURE) ×6 IMPLANT
SUT VIC AB 2-0 CP2 18 (SUTURE) ×6 IMPLANT
SYR CONTROL 10ML LL (SYRINGE) ×3 IMPLANT
TOWEL GREEN STERILE (TOWEL DISPOSABLE) ×3 IMPLANT
TOWEL GREEN STERILE FF (TOWEL DISPOSABLE) ×3 IMPLANT
TRAY FOLEY MTR SLVR 16FR STAT (SET/KITS/TRAYS/PACK) IMPLANT
UNDERPAD 30X30 (UNDERPADS AND DIAPERS) IMPLANT
WATER STERILE IRR 1000ML POUR (IV SOLUTION) ×3 IMPLANT

## 2019-02-03 NOTE — H&P (Signed)
NAME:  Taylor Robinson, MRN:  [id number], DOB:  1961-05-19, LOS: 0 ADMISSION DATE:  01/31/2019, CONSULTATION DATE:  02/20/2019 REFERRING MD:  Dr. 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  Neurosurgery does not feel that emergent surgery is warranted at this time.  PCCM consulted for admit but with recommendations for early transfer to transplant center.   History of present illness   HPI obtained from medical chart review as patient is encephalopathic.   58 year old female with history of obesity, thyroid problems, GERD, and DMT2 presenting from home with worsening jaundice over last 1-2 weeks, falls, and increasing confusion/ lethargy today.   Daughter had provided some history in ER.  Reportedly been more sedentary recently with problems of coughing spells leading into vomiting when attempting to eat with associated abdominal pain patient related to multiple episodes of vomiting.  No reported fevers, alcohol abuse, or known history of liver problems.    In ER, afebrile, normotensive and normal oxygen saturations but remains altered but protecting her airway.  Workup noted for Na 116, K 3.3, CL 79, glucose 115, sCr 1.26, alk phos 241, AST 173, ALT 56, t. Bili 31.4, direct bili 19.6, INR 1.4, ammonia 68,  WBC 11.2, Hgb 11.1, Platelets 188, tylenol level neg, Lactic 3 -> 1.8, UA and UDS pending VBG 7.407/ 37/ 32/ 23.4, CXR showing RLL opacity and left lower atelectasis.  CT head showed acute on chronic large left frontoparietal SDH with 6 mm rightward midline shift.  CT chest and abd/ pelvis ordered but not performed yet.  Neurosurgery consulted.  PCCM consulted for admit.   Past Medical History  Thyroid problems, GERD, DMT2  Significant Hospital Events    Consults:  NSGY  Procedures:   Significant Diagnostic Tests:  6/5 CTH >> Large subdural hematoma along the left frontoparietal convexity measuring 2.6 cm in width and causing a 6 mm rightward midline shift.  6/5 CT abd/pelvis 6/5  CT chest   Micro Data:  6/5 SARS coronavirus 2 >> neg 6/5 BCx 2 >> 6/5 UC >>  Antimicrobials:   Interim history/subjective:   Critical care called this morning.  Patient has ongoing neurologic decline in the ER.  I recommended the emergency department to contact neurosurgery as she has a known subdural hematoma.  Patient was taken to the CAT scanner from the ER which revealed worsening of her subdural hematoma.  Neurosurgery was called back to evaluate.  I have spoke with neurosurgery this morning, they have discussed the risk benefits and alternatives of proceeding with surgical evacuation with the patient's daughter.  At this time they would like to pursue all aggressive measures.  Neurosurgery has asked critical care to admit due to her multiple medical problems including current acute on chronic liver failure.  Review of the patient's medical record reveals that she was seen via telemetry visit on January 29, 2019.  At that time had complaints of low back pain allergies and type 2 diabetes.  In the telemetry visit she was given a prescription for 800 mg Motrin every 8 hours as well as her metformin was discontinued and she was started on  sitagliptin.  There was no physical exam documented regarding jaundice appearance but it does appear there was a video visit.  Objective   Blood pressure (!) 159/62, pulse 85, temperature 98.2 F (36.8 C), temperature source Oral, resp. rate (!) 21, height 5' 6" (1.676 m), weight 97.5 kg, SpO2 99 %.    Vent Mode: PRVC FiO2 (%):  [100 %] 100 % Set Rate:  [18 bmp] 18 bmp Vt Set:   [470 mL] 470 mL PEEP:  [5 cmH20] 5 cmH20 Plateau Pressure:  [21 cmH20] 21 cmH20  No intake or output data in the 24 hours ending 02/23/2019 0950 Filed Weights   02/19/2019 0800  Weight: 97.5 kg    Examination: General: Obese chronically ill-appearing female intubated on mechanical life support HEENT: Mucous membranes dry, right pupil 3 mm left pupil 5 mm, right responsive to light, left nonresponsive to light Neuro: She will withdrawal to pain, has a posturing-like movement, no response to voice CV: Regular rate and rhythm, S1-S2, no MRG PULM: Bilateral ventilated breath sounds GI: Soft, nontender mildly distended, obese pannus Extremities: Bilateral lower extremity edema Skin: Skin breakdown within skin folds, severe jaundice scattered excoriations and bruising  Images below documented from initial consultation on 02/01/2019:  left groin   Right groin   Right breast fold   Resolved Hospital Problem list    Assessment & Plan:    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  Acute hypoxemic respiratory failure requiring intubation and mechanical ventilation  - remains intubated due to mental status and inability to protect airway.  Acute liver failure - Coagulopathy and jaundice, hyperbilirubinemia - Unclear etiology of liver failure - new medications within the last week, sitagliptin (0.5% risk liver injury) and ibuprofen 826m (rare as well, but usually occurs within 1-3 weeks of initiation)  -CT imaging with severe fatty infiltration of the liver - Hepatitis A, B and C negative  -Tylenol level negative - Direct bilirubin 19, total bilirubin 31 on admission -We will consult GI for their input. - If patient survives neurosurgical procedure we will continue to [facility] -I do believe with her current medical  comorbidities that her overall outcome is poor  Acute metabolic encephalopathy Elevated ammonia, hyperammonemia Hepatic encephalopathy - We will start treatment with lactulose  Acute renal failure Hyponatremia, hypervolemic on exam - Multifactorial, possibly related to acute liver failure.  Elevated troponin - secondary to multisystem failure as above  - marker of severity of illness   Lactic acidosis Anion gap metabolic acidosis - Likely type B lactic acidosis from inability of lactate clearance due to liver failure  Diabetes type 2 -CBGs with SSI  Goals of care: I called and spoke with the patient's daughter ACristie Hem telephone #[phone]  She also has a younger brother who is 112years old.  They are currently making medical decisions for the patient.  Upon further discussion with her she does state that her mother is married.  They are separated but not legally divorced.  Her husband is at [employer]  And the daughter has attempted to reach out to him this morning but there has been no answer.  His telephone number is [phone].  I have attempted to call the number provided by his daughter to reach [place] was no [place].  Best practice:  Labs   CBC: Recent Labs  Lab 02/07/2019 1749 02/22/2019 1807  WBC 11.2*  --   NEUTROABS 9.4*  --   HGB 11.1* 18.0*  HCT 31.2* 53.0*  MCV 93.4  --   PLT 188  --     Basic Metabolic Panel: Recent Labs  Lab 02/26/2019 1749 02/09/2019 1807 01/30/2019 0740  NA 116* 115* 118*  K 3.3* 3.3* 3.5  CL 79*  --  80*  CO2 23  --  21*  GLUCOSE 115*  --  109*  BUN 13  --  17  CREATININE 1.26*  --  1.47*  CALCIUM 8.4*  --  8.3*   GFR: Estimated Creatinine Clearance: 50.9 mL/min (A) (by C-G formula based on SCr of 1.47 mg/dL (H)). Recent Labs  Lab 02/01/2019 1749 02/06/2019 2010  WBC 11.2*  --   LATICACIDVEN 3.0* 1.8    Liver Function Tests: Recent Labs  Lab 01/29/2019 1749 02/24/2019 0740   AST 173* 163*  ALT 56* 51*  ALKPHOS 241* 234*  BILITOT 31.4* 28.7*  PROT 5.6* 5.4*  ALBUMIN 2.2* 2.0*   Recent Labs  Lab 02/25/2019 1749  LIPASE 46   Recent Labs  Lab 02/16/2019 1749  AMMONIA 68*    ABG    Component Value Date/Time   HCO3 23.4 02/19/2019 1807   TCO2 24 01/31/2019 1807   ACIDBASEDEF 1.0 02/27/2019 1807   O2SAT 62.0 01/31/2019 1807     Coagulation Profile: Recent Labs  Lab 02/20/2019 1749  INR 1.4*    Cardiac Enzymes: Recent Labs  Lab 02/04/2019 1749  TROPONINI 0.06*    HbA1C: No results found for: HGBA1C  CBG: No results for input(s): GLUCAP in the last 168 hours.  Review of Systems:   unable  Past Medical History  She,  has a past medical history of Diabetes mellitus without complication ([place]).   Surgical History   History reviewed. No pertinent surgical history.   Social History      Family History   Her family history is not on file.   Allergies No Known Allergies   Home Medications  Prior to Admission medications   Not on File   As of June 1st:  New Prescriptions  Instructions  fluticasone propionate 50 mcg/actuation nasal spray Commonly known as: FLONASE Started by: Lilia Pro, MD 2 sprays, Nasal, Daily  ibuprofen 800 mg tablet Commonly known as: ADVIL,MOTRIN Started by: Lilia Pro, MD 800 mg, Oral, Every 8 hours as needed  sitaGLIPtin 100 mg tablet Commonly known as: JANUVIA Started by: Lilia Pro, MD 100 mg, Oral, Daily   Continued Medications  Instructions  cetirizine 10 mg tablet Commonly known as: ZYRTEC 10 mg, Daily  DHEA 25 MG Caps 1 capsule, Daily  hydrOXYzine HCl 10 mg tablet Commonly known as: ATARAX TAKE 1 TABLET BY MOUTH EVERY 8 HOURS AS NEEDED FOR ITCHING  levothyroxine sodium 25 mcg tablet Commonly known as: SYNTHROID,LOVOTHROID,LEVOXYL TAKE 1 TABLET BY MOUTH EVERY DAY EXCEPT TAKE ONE AND ONE-HALF TABLET BY MOUTH ON SUNDAY  metFORMIN 500 MG tablet Commonly known as:  GLUCOPHAGE TAKE ONE TABLET BY MOUTH ONE TIME DAILY WITH BREAKFAST  montelukast 10 MG tablet Commonly known as: SINGULAIR TAKE 1 TABLET BY MOUTH EVERYDAY AT BEDTIME  NaCl 0.9 % SOLN 50 mL with ondansetron 40 MG/20ML SOLN 8 mg, IntraVENous, Once  omeprazole 20 mg capsule Commonly known as: PRILOSEC TAKE 1 CAPSULE BY MOUTH EVERY DAY  ondansetron 8 mg disintegrating tablet Commonly known as: ZOFRAN-ODT 8  mg, Oral, Every 8 hours as needed  SYMBICORT 160-4.5 mcg/actuation inhaler Generic drug: budesonide-formoterol TAKE 2 PUFFS BY MOUTH TWICE A DAY  THERA Tabs 1 tablet, Oral, Daily  VENTOLIN HFA 108 (90 Base) MCG/ACT inhaler Generic drug: albuterol sulfate HFA INHALE 2 PUFFS INTO THE LUNGS EVERY 4 (FOUR) HOURS AS NEEDED FOR WHEEZING.    This patient is critically ill with multiple organ system failure; which, requires frequent high complexity decision making, assessment, support, evaluation, and titration of therapies. This was completed through the application of advanced monitoring technologies and extensive interpretation of multiple databases. During this encounter critical care time was devoted to patient care services described in this note for 55 minutes.   Garner Nash, DO [facility] 02/14/2019 9:50 AM  Personal pager: [phone] If unanswered, please page CCM On-call: [phone]

## 2019-02-03 NOTE — Anesthesia Procedure Notes (Signed)
Performed by: Julieta Bellini, CRNA

## 2019-02-03 NOTE — Anesthesia Preprocedure Evaluation (Signed)
Anesthesia Evaluation  Patient identified by MRN, date of birth, ID band Patient unresponsive    Reviewed: Unable to perform ROS - Chart review onlyPreop documentation limited or incomplete due to emergent nature of procedure.  Airway        Dental   Pulmonary           Cardiovascular      Neuro/Psych    GI/Hepatic   Endo/Other  diabetes  Renal/GU      Musculoskeletal   Abdominal   Peds  Hematology   Anesthesia Other Findings   Reproductive/Obstetrics                             Anesthesia Physical Anesthesia Plan  ASA: IV  Anesthesia Plan: General   Post-op Pain Management:    Induction: Inhalational  PONV Risk Score and Plan: 3 and Treatment may vary due to age or medical condition  Airway Management Planned: Oral ETT  Additional Equipment: Arterial line  Intra-op Plan:   Post-operative Plan: Post-operative intubation/ventilation  Informed Consent:     Only emergency history available and History available from chart only  Plan Discussed with: CRNA and Surgeon  Anesthesia Plan Comments:         Anesthesia Quick Evaluation

## 2019-02-03 NOTE — Progress Notes (Signed)
Patient ID: Taylor Robinson, female   DOB: 12/20/1960, 58 y.o.   MRN: [id number] We came in around 730 to see this patient because we were called that she had taken a turn for the worse.  She was being intubated.  She had sonorous respirations and was somewhat extensor posturing.  Her pupils now are equal and reactive.  She is intubated.  They have repeated her CT scan and while the subdural hematoma looks about the same size I think there are a few more acute blood products.  Spoken with her daughter at length.  Given this deterioration I recommend urgent craniotomy for evacuation of the subdural hematoma.  Obviously there are risks because of her metabolic state.  I am concerned about her coagulopathy.  It is difficult to know if her worsening is secondary to her metabolic issues and liver issues or whether it is related to the subdural hematoma.  Ever, I think that it is best to remove the hematoma.  I have explained this to the daughter in detail.  She understands that there is a coagulopathy and it may be difficult to get the bleeding controlled.  I explained to her that the risk include but are not limited to bleeding, infection, need for further surgery, reaccumulation of subdural hematoma, throat, seizure, lack of relief of symptoms, worsening symptoms, and death.  She agrees to proceed.

## 2019-02-03 NOTE — Transfer of Care (Signed)
Immediate Anesthesia Transfer of Care Note  Patient: Taylor Robinson  Procedure(s) Performed: CRANIOTOMY HEMATOMA EVACUATION SUBDURAL (Left Head)  Patient Location: ICU  Anesthesia Type:General  Level of Consciousness: sedated and Patient remains intubated per anesthesia plan  Airway & Oxygen Therapy: Patient remains intubated per anesthesia plan and Patient placed on Ventilator (see vital sign flow sheet for setting)  Post-op Assessment: Report given to RN and Post -op Vital signs reviewed and stable  Post vital signs: Reviewed and stable  Last Vitals:  Vitals Value Taken Time  BP 125/64 02/12/2019 12:17 PM  Temp    Pulse 91 02/04/2019 12:20 PM  Resp 18 02/04/2019 12:20 PM  SpO2 100 % 02/26/2019 12:20 PM  Vitals shown include unvalidated device data.  Last Pain:  Vitals:   02/09/2019 2030  TempSrc: Oral  PainSc:          Complications: No apparent anesthesia complications   Patient transported to ICU with standard monitors (HR, BP, SPO2, RR) and emergency drugs/equipment. Controlled ventilation maintained via ambu bag. Report given to bedside RN and respiratory therapist. Pt connected to ICU monitor and ventilator. All questions answered and vital signs stable before leaving

## 2019-02-03 NOTE — Anesthesia Procedure Notes (Signed)
Arterial Line Insertion Start/End6/01/2019 10:22 AM Performed by: Julieta Bellini, CRNA  Patient location: OR. Preanesthetic checklist: patient identified, IV checked, site marked, risks and benefits discussed, surgical consent, monitors and equipment checked, pre-op evaluation, timeout performed and anesthesia consent Right, radial was placed Catheter size: 20 G Hand hygiene performed  and maximum sterile barriers used   Attempts: 1 Procedure performed without using ultrasound guided technique. Following insertion, dressing applied and Biopatch. Post procedure assessment: normal  Patient tolerated the procedure well with no immediate complications.

## 2019-02-03 NOTE — ED Provider Notes (Signed)
  Physical Exam  BP (!) 178/79   Pulse (!) 112   Temp 98.2 F (36.8 C) (Oral)   Resp 18   Ht 5\' 6"  (1.676 m)   Wt 97.5 kg   SpO2 97%   BMI 34.70 kg/m   Physical Exam Constitutional:      Appearance: She is obese. She is ill-appearing and toxic-appearing.  Skin:    Coloration: Skin is jaundiced.  Neurological:     GCS: GCS eye subscore is 1. GCS verbal subscore is 1. GCS motor subscore is 2.     Comments: Exam initially with asymmetric pupils, GCS of 3, however on immediate recheck, pupils appear equal, left side extensor posturing noted with pain     ED Course/Procedures     .Critical Care Performed by: Gareth Morgan, MD Authorized by: Gareth Morgan, MD   Critical care provider statement:    Critical care time (minutes):  45   Critical care was time spent personally by me on the following activities:  Discussions with consultants, evaluation of patient's response to treatment, examination of patient, ordering and performing treatments and interventions, ordering and review of laboratory studies, ordering and review of radiographic studies, pulse oximetry and re-evaluation of patient's condition Procedure Name: Intubation Date/Time: 01/31/2019 8:17 AM Performed by: Gareth Morgan, MD Pre-anesthesia Checklist: Patient identified Oxygen Delivery Method: Nasal cannula Induction Type: Rapid sequence Laryngoscope Size: Glidescope Grade View: Grade I Tube size: 7.5 mm Number of attempts: 1       MDM    Received care of patient at 7 AM from Dr. Roxanne Mins.  Please see previous notes for history, physical and care.  Briefly this is a 58 year old female who presented with altered mental status and was found to have a subdural hematoma and liver failure with hyperbilirubinemia.  Initially a discussion was had with the ICU, who had recommended transfer to [facility] MICU given her liver failure so she could be at a transplant center.  [facility] had accepted the patient and she has been  awaiting transfer.  Neurosurgery had evaluated patient last night, and at the time she did not appear to have a need for emergent surgery, with concern regarding her other acute medical problems.  I reevaluated the patient on assumption of care this AM.  Her GCS at this time is 4 with extensor posturing noted on the left side with painful stimuli.  Paged neurosurgery, Dr. Ronnald Nian ED discussed with family, and I intubated her for airway protection.  Neurosurgery came to bedside for evaluation.  Emergent repeat head CT was performed, and patient was taken to the OR emergently for craniotomy.  She will be admitted to the ICU for further care. Daughter, Cristie Hem, updated on plan of care.     Gareth Morgan, MD 02/17/2019 [phone]

## 2019-02-03 NOTE — ED Notes (Signed)
This RN called [facility], they still have no beds. Pager for Critical Care paged to this RN's phone to ask for guidance on plan for patient. Pt has been in the ED for 13 hours now.

## 2019-02-03 NOTE — Anesthesia Postprocedure Evaluation (Signed)
Anesthesia Post Note  Patient: Loretta Doutt  Procedure(s) Performed: CRANIOTOMY HEMATOMA EVACUATION SUBDURAL (Left Head)     Patient location during evaluation: SICU Anesthesia Type: General Level of consciousness: sedated Pain management: pain level controlled Vital Signs Assessment: post-procedure vital signs reviewed and stable Respiratory status: patient remains intubated per anesthesia plan Cardiovascular status: stable Postop Assessment: no apparent nausea or vomiting Anesthetic complications: no    Last Vitals:  Vitals:   2019/03/02 1300 03/02/19 1400  BP: (!) 130/55 (!) 104/50  Pulse: 91 79  Resp: 18 19  Temp:    SpO2: 99% 100%    Last Pain:  Vitals:   02/18/2019 2030  TempSrc: Oral  PainSc:                  Maude Gloor

## 2019-02-03 NOTE — Progress Notes (Signed)
Patient was transported to the OR without incident.

## 2019-02-03 NOTE — ED Notes (Signed)
This RN in room. Pt is minimally responsive to severe painful stimuli. Airway intact. VSS. Still waiting for bed at [facility].

## 2019-02-03 NOTE — Progress Notes (Addendum)
PCCM:   HD stable post surgery. Neuro exam improved.  Withdraws to pain, cough and gag present  I called and spoke with patients husband Lindie Roberson.  I will call [facility] to speak with hepatology. I am awaiting their phone call.   Garner Nash, DO [place] [facility] 02/10/2019 3:56 PM     I spoke Dr. Drue Novel at [facility]. We have decided to keep the patient here at [facility].  We will observe her LFTs and INR We will schedule vitamin K 5mg  IV X 3 days  Additional labs ordered for liver eval.  Case also discussed with Dr. Paulita Fujita from [place].  I called and updated the husband.   [place] [place] [facility] 02/12/2019 4:53 PM

## 2019-02-03 NOTE — ED Notes (Signed)
Pt taken to CT by this RN and RT 

## 2019-02-03 NOTE — ED Notes (Signed)
[facility] contacted by NS for update, transport line to contact MICU charge for eta

## 2019-02-03 NOTE — Progress Notes (Signed)
Art line pressure reads higher than cuff pressure, go by cuff pressure for SBP parameter <160 per neurology

## 2019-02-03 NOTE — ED Notes (Signed)
Dr Valeta Harms from CCU in room to assess patient. Pt now has left pupil dilated, right pupil still reactive but are assymmetric. Pt is to go to the OR for crani with neurosurgery. Pt is ready to go asap.

## 2019-02-03 NOTE — ED Notes (Signed)
Contacted [facility] for update; unit shifting beds around, hope to receive bed in next hour per transfer center

## 2019-02-03 NOTE — ED Notes (Addendum)
This RN, nurse extern, EDP and RT in room for intubation. Neuro came to see pt and states that pt's status has changed. Pt has some posturing with painful stimuli. Dr Ronnald Nian spoke with pt's family about intubation and code status and pt is full code. Will be admitted here after intubation.

## 2019-02-03 NOTE — Progress Notes (Addendum)
Honeycomb dressing on head saturated with blood, change to gauze with pressure tape per Maudie Mercury NP

## 2019-02-03 NOTE — Op Note (Signed)
02/05/2019 - 2019/02/20  11:39 AM  PATIENT:  Taylor Robinson  58 y.o. female  PRE-OPERATIVE DIAGNOSIS: Large left acute subdural hematoma  POST-OPERATIVE DIAGNOSIS:  same  PROCEDURE: Left craniotomy for evacuation of acute subdural hematoma  SURGEON:  Sherley Bounds, MD  ASSISTANTS: Glenford Peers, FNP  ANESTHESIA:   General  EBL: 50 ml  Total I/O In: 37 [Blood:621] Out: 80 [Blood:50]  BLOOD ADMINISTERED: none  DRAINS: Subgaleal Hemovac drain and 7 flat JP drain in the subdural space  SPECIMEN:  none  INDICATION FOR PROCEDURE: This patient presented with florid liver failure and mental status changes. Imaging showed a left acute subdural hematoma.  Initially it was recommended that we wait on surgery and transfer to a tertiary care hospital with a transplant unit.  At that point she was doing okay neurologically and it was felt safe for transfer.  However, a bed did not become available quickly and she declined neurologically.  This required intubation.  CT scan was repeated and showed some increase in acute blood products although there was no overall change in the size of the hematoma.  Recommended left craniotomy for evacuation of subdural hematoma. Patient's family understood the risks, benefits, and alternatives and potential outcomes and wished to proceed.  PROCEDURE DETAILS: The patient was taken to the operating room and after induction of adequate generalized endotracheal anesthesia, the head was affixed in a 3 point [place] head rest, and turned to the right to expose the left frontotemporal parietal region. The head was shaved and then cleaned and then prepped with DuraPrep and draped in the usual sterile fashion. 10 cc of local anesthetic was injected, and a lazy S incision was made on the left of the head. Raney clips were placed to establish hemostasis of the scalp, the muscle was reflected with the scalp flap, to expose the the left frontal to temporal parietal region. A burr  hole was placed, and a craniotomy flap was turned utilizing the high-speed, air powered drill. The flap was then placed in bacitracin-containing saline solution, and the dura was opened to expose a very large left acute subdural hematoma. A hematoma was then removed with a combination of irrigation and suction. I continued to irrigate until the irrigant was clear to, and dried any bleeding with bipolar cautery.  There was a small area of bleeding from the surface of the brain and the superior posterior left temporal lobe and this was coagulated gently.  I then placed a subdural drain through separate stab incision and close the dura with a running 4-0 Nurolon suture. Dural tack up sutures were placed. The dura was lined with Gelfoam, and the craniotomy flap was replaced with doggie-bone plates. The wound was copiously irrigated. A subgaleal drain was placed, and the galea was then closed with interrupted 2-0 Vicryl suture. The skin was then closed with staples a sterile dressing was applied. The patient was then taken out of the 3-point May[place] headrest and awakened from general anesthesia though left intubated, and transported to the recovery room in critical condition. At the end of the procedure all sponge, needle, and instrument counts were correct.    PLAN OF CARE: Admit to inpatient   PATIENT DISPOSITION:  ICU - intubated and critically ill.   Delay start of Pharmacological VTE agent (>24hrs) due to surgical blood loss or risk of bleeding:  yes

## 2019-02-04 ENCOUNTER — Inpatient Hospital Stay (HOSPITAL_COMMUNITY): Payer: BC Managed Care – PPO

## 2019-02-04 DIAGNOSIS — R4182 Altered mental status, unspecified: Secondary | ICD-10-CM | POA: Diagnosis present

## 2019-02-04 LAB — GLUCOSE, CAPILLARY
Glucose-Capillary: 128 mg/dL — ABNORMAL HIGH (ref 70–99)
Glucose-Capillary: 132 mg/dL — ABNORMAL HIGH (ref 70–99)
Glucose-Capillary: 145 mg/dL — ABNORMAL HIGH (ref 70–99)
Glucose-Capillary: 130 mg/dL — ABNORMAL HIGH (ref 70–99)
Glucose-Capillary: 160 mg/dL — ABNORMAL HIGH (ref 70–99)
Glucose-Capillary: 141 mg/dL — ABNORMAL HIGH (ref 70–99)

## 2019-02-04 LAB — COMPREHENSIVE METABOLIC PANEL
ALT: 35 U/L (ref 0–44)
ALT: 28 U/L (ref 0–44)
ALT: 27 U/L (ref 0–44)
AST: 135 U/L — ABNORMAL HIGH (ref 15–41)
AST: 128 U/L — ABNORMAL HIGH (ref 15–41)
AST: 130 U/L — ABNORMAL HIGH (ref 15–41)
Albumin: 1.9 g/dL — ABNORMAL LOW (ref 3.5–5.0)
Albumin: 1.7 g/dL — ABNORMAL LOW (ref 3.5–5.0)
Albumin: 1.8 g/dL — ABNORMAL LOW (ref 3.5–5.0)
Alkaline Phosphatase: 203 U/L — ABNORMAL HIGH (ref 38–126)
Alkaline Phosphatase: 194 U/L — ABNORMAL HIGH (ref 38–126)
Alkaline Phosphatase: 192 U/L — ABNORMAL HIGH (ref 38–126)
Anion gap: 14 (ref 5–15)
Anion gap: 15 (ref 5–15)
Anion gap: 14 (ref 5–15)
BUN: 16 mg/dL (ref 6–20)
BUN: 19 mg/dL (ref 6–20)
BUN: 23 mg/dL — ABNORMAL HIGH (ref 6–20)
CO2: 21 mmol/L — ABNORMAL LOW (ref 22–32)
CO2: 18 mmol/L — ABNORMAL LOW (ref 22–32)
CO2: 19 mmol/L — ABNORMAL LOW (ref 22–32)
Calcium: 7.8 mg/dL — ABNORMAL LOW (ref 8.9–10.3)
Calcium: 7.5 mg/dL — ABNORMAL LOW (ref 8.9–10.3)
Calcium: 7.5 mg/dL — ABNORMAL LOW (ref 8.9–10.3)
Chloride: 87 mmol/L — ABNORMAL LOW (ref 98–111)
Chloride: 89 mmol/L — ABNORMAL LOW (ref 98–111)
Chloride: 89 mmol/L — ABNORMAL LOW (ref 98–111)
Creatinine, Ser: 1.26 mg/dL — ABNORMAL HIGH (ref 0.44–1.00)
Creatinine, Ser: 1.42 mg/dL — ABNORMAL HIGH (ref 0.44–1.00)
Creatinine, Ser: 1.53 mg/dL — ABNORMAL HIGH (ref 0.44–1.00)
GFR calc Af Amer: 56 mL/min — ABNORMAL LOW (ref >60)
GFR calc Af Amer: 48 mL/min — ABNORMAL LOW (ref >60)
GFR calc Af Amer: 44 mL/min — ABNORMAL LOW (ref >60)
GFR calc non Af Amer: 48 mL/min — ABNORMAL LOW (ref >60)
GFR calc non Af Amer: 41 mL/min — ABNORMAL LOW (ref >60)
GFR calc non Af Amer: 38 mL/min — ABNORMAL LOW (ref >60)
Glucose, Bld: 132 mg/dL — ABNORMAL HIGH (ref 70–99)
Glucose, Bld: 143 mg/dL — ABNORMAL HIGH (ref 70–99)
Glucose, Bld: 144 mg/dL — ABNORMAL HIGH (ref 70–99)
Potassium: 3.5 mmol/L (ref 3.5–5.1)
Potassium: 3.6 mmol/L (ref 3.5–5.1)
Potassium: 3.5 mmol/L (ref 3.5–5.1)
Sodium: 122 mmol/L — ABNORMAL LOW (ref 135–145)
Sodium: 122 mmol/L — ABNORMAL LOW (ref 135–145)
Sodium: 122 mmol/L — ABNORMAL LOW (ref 135–145)
Total Bilirubin: 26.4 mg/dL (ref 0.3–1.2)
Total Bilirubin: 24.9 mg/dL (ref 0.3–1.2)
Total Bilirubin: 27.8 mg/dL (ref 0.3–1.2)
Total Protein: 5.1 g/dL — ABNORMAL LOW (ref 6.5–8.1)
Total Protein: 4.7 g/dL — ABNORMAL LOW (ref 6.5–8.1)
Total Protein: 5.1 g/dL — ABNORMAL LOW (ref 6.5–8.1)

## 2019-02-04 LAB — CBC WITH DIFFERENTIAL/PLATELET
Abs Immature Granulocytes: 1.12 10*3/uL — ABNORMAL HIGH (ref 0.00–0.07)
Basophils Absolute: 0 10*3/uL (ref 0.0–0.1)
Basophils Relative: 0 %
Eosinophils Absolute: 0 10*3/uL (ref 0.0–0.5)
Eosinophils Relative: 0 %
HCT: 20.9 % — ABNORMAL LOW (ref 36.0–46.0)
Hemoglobin: 7.4 g/dL — ABNORMAL LOW (ref 12.0–15.0)
Immature Granulocytes: 10 %
Lymphocytes Relative: 12 %
Lymphs Abs: 1.3 10*3/uL (ref 0.7–4.0)
MCH: 33 pg (ref 26.0–34.0)
MCHC: 35.4 g/dL (ref 30.0–36.0)
MCV: 93.3 fL (ref 80.0–100.0)
Monocytes Absolute: 1 10*3/uL (ref 0.1–1.0)
Monocytes Relative: 9 %
Neutro Abs: 7.4 10*3/uL (ref 1.7–7.7)
Neutrophils Relative %: 69 %
Platelets: 145 10*3/uL — ABNORMAL LOW (ref 150–400)
RBC: 2.24 MIL/uL — ABNORMAL LOW (ref 3.87–5.11)
RDW: 14.2 % (ref 11.5–15.5)
WBC: 10.8 10*3/uL — ABNORMAL HIGH (ref 4.0–10.5)
nRBC: 1.6 % — ABNORMAL HIGH (ref 0.0–0.2)

## 2019-02-04 LAB — BPAM FFP
Blood Product Expiration Date: 202006102359
Blood Product Expiration Date: 202006102359
ISSUE DATE / TIME: 202006061043
ISSUE DATE / TIME: 202006061043
Unit Type and Rh: 5100
Unit Type and Rh: 5100

## 2019-02-04 LAB — PROTIME-INR
INR: 1.3 — ABNORMAL HIGH (ref 0.8–1.2)
INR: 1.3 — ABNORMAL HIGH (ref 0.8–1.2)
Prothrombin Time: 15.8 seconds — ABNORMAL HIGH (ref 11.4–15.2)
Prothrombin Time: 15.6 seconds — ABNORMAL HIGH (ref 11.4–15.2)

## 2019-02-04 LAB — PREPARE FRESH FROZEN PLASMA
Unit division: 0
Unit division: 0

## 2019-02-04 LAB — URINE CULTURE
Culture: NO GROWTH
Culture: <10000 — AB

## 2019-02-04 LAB — CBC
HCT: 22.6 % — ABNORMAL LOW (ref 36.0–46.0)
Hemoglobin: 8.3 g/dL — ABNORMAL LOW (ref 12.0–15.0)
MCH: 33.7 pg (ref 26.0–34.0)
MCHC: 36.7 g/dL — ABNORMAL HIGH (ref 30.0–36.0)
MCV: 91.9 fL (ref 80.0–100.0)
Platelets: 146 10*3/uL — ABNORMAL LOW (ref 150–400)
RBC: 2.46 MIL/uL — ABNORMAL LOW (ref 3.87–5.11)
RDW: 14 % (ref 11.5–15.5)
WBC: 9.9 10*3/uL (ref 4.0–10.5)
nRBC: 1.4 % — ABNORMAL HIGH (ref 0.0–0.2)

## 2019-02-04 LAB — HIV ANTIBODY (ROUTINE TESTING W REFLEX): HIV Screen 4th Generation wRfx: NONREACTIVE

## 2019-02-04 LAB — AMMONIA
Ammonia: 52 umol/L — ABNORMAL HIGH (ref 9–35)
Ammonia: 52 umol/L — ABNORMAL HIGH (ref 9–35)

## 2019-02-04 LAB — PHOSPHORUS: Phosphorus: 2.7 mg/dL (ref 2.5–4.6)

## 2019-02-04 LAB — MAGNESIUM: Magnesium: 2 mg/dL (ref 1.7–2.4)

## 2019-02-04 LAB — IGG: IgG (Immunoglobin G), Serum: 784 mg/dL (ref 586–1602)

## 2019-02-04 LAB — TRIGLYCERIDES: Triglycerides: 407 mg/dL — ABNORMAL HIGH (ref <150)

## 2019-02-04 LAB — ALPHA-1-ANTITRYPSIN: A-1 Antitrypsin, Ser: 188 mg/dL — ABNORMAL HIGH (ref 101–187)

## 2019-02-04 MED ORDER — MIDAZOLAM HCL 2 MG/2ML IJ SOLN
2.0000 mg | INTRAMUSCULAR | Status: DC | PRN
  Administered 2019-02-04: 20:00:00 2 mg via INTRAVENOUS
  Filled 2019-02-04: qty 2

## 2019-02-04 MED ORDER — SODIUM CHLORIDE 0.9 % IV BOLUS
1000.0000 mL | Freq: Once | INTRAVENOUS | Status: AC
  Administered 2019-02-04: 1000 mL via INTRAVENOUS

## 2019-02-04 MED ORDER — MIDAZOLAM HCL 2 MG/2ML IJ SOLN: 2.0000 mg | INTRAMUSCULAR | Status: DC | PRN

## 2019-02-04 MED ORDER — FENTANYL CITRATE (PF) 100 MCG/2ML IJ SOLN: 50.0000 ug | INTRAMUSCULAR | Status: DC | PRN

## 2019-02-04 MED ORDER — DEXMEDETOMIDINE HCL IN NACL 200 MCG/50ML IV SOLN
0.4000 ug/kg/h | INTRAVENOUS | Status: DC
  Administered 2019-02-04: 09:00:00 0.4 ug/kg/h via INTRAVENOUS
  Filled 2019-02-04: qty 50

## 2019-02-04 MED ORDER — CHLORHEXIDINE GLUCONATE CLOTH 2 % EX PADS
6.0000 | MEDICATED_PAD | Freq: Every day | CUTANEOUS | Status: DC
  Administered 2019-02-05 – 2019-02-14 (×10): 6 via TOPICAL

## 2019-02-04 MED ORDER — SODIUM CHLORIDE 0.9 % IV BOLUS
500.0000 mL | Freq: Once | INTRAVENOUS | Status: AC
  Administered 2019-02-04: 13:00:00 500 mL via INTRAVENOUS

## 2019-02-04 NOTE — Consult Note (Signed)
[facility] Consultation Note  Referring Provider: Dr. Valeta Harms (PCCM) Primary Care Physician:  Hermine Messick, MD  Reason for Consultation:  Elevated bilirubin, liver failure?  HPI: Taylor Robinson is a 58 y.o. female whom we are asked to see for above reason.  Patient has morbid obesity and was admitted with altered mental status and subdural hematoma was seen which was evacuated.  She had surgery and is intubated and minimally responsive.  Had significantly elevated bilirubin (30s) with not much other liver enzyme dysfunction.  No known prior history of cirrhosis or liver failure.  No other clear offending acute medication exposures.  No reported hematemesis or blood in stool.  No further history able to be obtained due to patient's altered mental status.   Past Medical History:  Diagnosis Date  . Diabetes mellitus without complication ([place])     History reviewed. No pertinent surgical history.  Prior to Admission medications   Not on File    Current Facility-Administered Medications  Medication Dose Route Frequency Provider Last Rate Last Dose  . 0.9 % NaCl with KCl 20 mEq/ L  infusion   Intravenous Continuous Eustace Moore, MD 75 mL/hr at 02/04/19 0355 1,000 mL at 02/04/19 0355  . chlorhexidine gluconate (MEDLINE KIT) (PERIDEX) 0.12 % solution 15 mL  15 mL Mouth Rinse BID Minor, Grace Bushy, NP   15 mL at 02/04/19 0823  . Chlorhexidine Gluconate Cloth 2 % PADS 6 each  6 each Topical Daily Minor, Grace Bushy, NP   6 each at 02/18/2019 1245  . dexamethasone (DECADRON) injection 4 mg  4 mg Intravenous Q6H Eustace Moore, MD       Followed by  . [START ON 02/05/2019] dexamethasone (DECADRON) injection 4 mg  4 mg Intravenous Q8H Eustace Moore, MD      . fentaNYL (SUBLIMAZE) injection 50 mcg  50 mcg Intravenous Q15 min PRN Icard, Bradley L, DO      . fentaNYL (SUBLIMAZE) injection 50 mcg  50 mcg Intravenous Q15 min PRN Clydell Hakim, MD      . fentaNYL (SUBLIMAZE) injection 50-200 mcg   50-200 mcg Intravenous Q30 min PRN Icard, Bradley L, DO      . labetalol (NORMODYNE) injection 10-40 mg  10-40 mg Intravenous Q10 min PRN Eustace Moore, MD   20 mg at 02/08/2019 1637  . lactulose (CHRONULAC) 10 GM/15ML solution 20 g  20 g Per Tube TID Icard, Bradley L, DO   20 g at 02/04/19 1023  . levETIRAcetam (KEPPRA) IVPB 500 mg/100 mL premix  500 mg Intravenous Q12H Eustace Moore, MD 400 mL/hr at 02/04/19 0007 500 mg at 02/04/19 0007  . MEDLINE mouth rinse  15 mL Mouth Rinse 10 times per day Minor, Grace Bushy, NP   15 mL at 02/04/19 1039  . midazolam (VERSED) injection 2 mg  2 mg Intravenous Q15 min PRN Clydell Hakim, MD      . midazolam (VERSED) injection 2 mg  2 mg Intravenous Q2H PRN Clydell Hakim, MD      . morphine 2 MG/ML injection 1-2 mg  1-2 mg Intravenous Q2H PRN Eustace Moore, MD      . nystatin (MYCOSTATIN/NYSTOP) topical powder   Topical TID Eustace Moore, MD   1 Bottle at 02/04/19 1024  . ondansetron (ZOFRAN) tablet 4 mg  4 mg Oral Q4H PRN Eustace Moore, MD       Or  . ondansetron [facility]) injection 4 mg  4 mg Intravenous Q4H  PRN Eustace Moore, MD      . pantoprazole (PROTONIX) injection 40 mg  40 mg Intravenous QHS Eustace Moore, MD   40 mg at 02/01/2019 2112  . phytonadione (VITAMIN K) 5 mg in dextrose 5 % 50 mL IVPB  5 mg Intravenous Daily Icard, Bradley L, DO 50 mL/hr at 02/04/19 1027 5 mg at 02/04/19 1027  . promethazine (PHENERGAN) tablet 12.5-25 mg  12.5-25 mg Oral Q4H PRN Eustace Moore, MD      . propofol (DIPRIVAN) 1000 MG/100ML infusion  0-50 mcg/kg/min Intravenous Continuous Icard, Bradley L, DO 11.7 mL/hr at 02/04/19 0009 20 mcg/kg/min at 02/04/19 0009    Allergies as of 02/20/2019  . (No Known Allergies)    History reviewed. No pertinent family history.  Social History   Socioeconomic History  . Marital status: Married    Spouse name: Not on file  . Number of children: Not on file  . Years of education: Not on file  . Highest education level: Not on  file  Occupational History  . Not on file  Social Needs  . Financial resource strain: Not on file  . Food insecurity:    Worry: Not on file    Inability: Not on file  . Transportation needs:    Medical: Not on file    Non-medical: Not on file  Tobacco Use  . Smoking status: Not on file  Substance and Sexual Activity  . Alcohol use: Not on file  . Drug use: Not on file  . Sexual activity: Not on file  Lifestyle  . Physical activity:    Days per week: Not on file    Minutes per session: Not on file  . Stress: Not on file  Relationships  . Social connections:    Talks on phone: Not on file    Gets together: Not on file    Attends religious service: Not on file    Active member of club or organization: Not on file    Attends meetings of clubs or organizations: Not on file    Relationship status: Not on file  . Intimate partner violence:    Fear of current or ex partner: Not on file    Emotionally abused: Not on file    Physically abused: Not on file    Forced sexual activity: Not on file  Other Topics Concern  . Not on file  Social History Narrative  . Not on file    Review of Systems: Unable to obtain due to patient's altered mental status.  Physical Exam: Vital signs in last 24 hours: Temp:  [99.4 F (37.4 C)-101.6 F (38.7 C)] 101.6 F (38.7 C) (06/07 0800) Pulse Rate:  [74-106] 84 (06/07 0800) Resp:  [17-29] 18 (06/07 0800) BP: (101-183)/(48-99) 127/55 (06/07 0800) SpO2:  [98 %-100 %] 98 % (06/07 0800) Arterial Line BP: (166-190)/(50-66) 186/54 (06/07 0800) FiO2 (%):  [30 %-50 %] 30 % (06/07 0909) Weight:  [103.1 kg] 103.1 kg (06/07 0530) Last BM Date: 01/30/2019 General:   Obese, intubated, jaundice Head:  Normocephalic and atraumatic. Eyes:  Sclera intensely icteric.   Conjunctiva pink. Ears:   Can't assess Nose:  No deformity, discharge,  or lesions. Mouth:  No deformity or lesions.  Oropharynx pink & moist. Neck:  Thick but Supple; no masses or  thyromegaly. Lungs:  Distant Heart:  Distant, regular Abdomen:  Obese, soft, no obvious hepatomegaly (but hard to assess due to patient's size)   Msk:  Symmetrical without  gross deformities. Normal posture. Pulses:  Normal pulses noted. Extremities:  Without clubbing or edema. Neurologic: Intubated, minimally responsive (even off most sedatives) Skin:  Profoundly jaundiced Psych:  Minimally responsive   Lab Results: Recent Labs    02/22/2019 1749  02/09/2019 1033 02/18/2019 1434 02/04/19 0444  WBC 11.2*  --   --  8.7 9.9  HGB 11.1*   < > 10.5* 8.8* 8.3*  HCT 31.2*   < > 31.0* 24.6* 22.6*  PLT 188  --   --  165 146*   < > = values in this interval not displayed.   BMET Recent Labs    02/01/2019 1434 02/23/2019 1826 02/04/19 0444  NA 119* 117* 122*  K 3.0* 3.2* 3.5  CL 82* 83* 87*  CO2 21* 22 21*  GLUCOSE 115* 133* 132*  BUN '17 17 16  ' CREATININE 1.33* 1.34* 1.26*  CALCIUM 7.9* 7.6* 7.8*   LFT Recent Labs    02/01/2019 1749  02/04/19 0444  PROT 5.6*   < > 5.1*  ALBUMIN 2.2*   < > 1.9*  AST 173*   < > 135*  ALT 56*   < > 35  ALKPHOS 241*   < > 203*  BILITOT 31.4*   < > 26.4*  BILIDIR 19.6*  --   --    < > = values in this interval not displayed.   PT/INR Recent Labs    02/19/2019 1826 02/04/19 0444  LABPROT 17.2* 15.8*  INR 1.4* 1.3*    Studies/Results: Ct Head Wo Contrast  Result Date: 02/04/2019 CLINICAL DATA:  Intracranial hemorrhage follow up EXAM: CT HEAD WITHOUT CONTRAST TECHNIQUE: Contiguous axial images were obtained from the base of the skull through the vertex without intravenous contrast. COMPARISON:  02/17/2019 FINDINGS: Brain: There is new, acute intraparenchymal hemorrhage within the left temporal lobe, measuring 3.4 x 2.8 x 2.5 cm (volume = 12 cm^3). There is small amount of overlying subarachnoid blood. There is moderate edema within the left temporal lobe. Patient is status post evacuation of a left-sided subdural hematoma. There is a subdural drainage  catheter along the left convexity. Midline shift has substantially resolved. No hydrocephalus. Vascular: No abnormal hyperdensity of the major intracranial arteries or dural venous sinuses. No intracranial atherosclerosis. Skull: Status post left parietal craniotomy. Sinuses/Orbits: No fluid levels or advanced mucosal thickening of the visualized paranasal sinuses. No mastoid or middle ear effusion. The orbits are normal. IMPRESSION: 1. Acute intraparenchymal hematoma within the left temporal lobe with volume of 12 mL and moderate surrounding edema. Small amount of overlying subarachnoid blood. 2. Status post evacuation of left hemispheric subdural hematoma with resolution of midline shift. Critical Value/emergent results were called by telephone at the time of interpretation on 02/04/2019 at 5:52 am to Ezra Sites, NP , who verbally acknowledged these results. Electronically Signed   By: Ulyses Jarred M.D.   On: 02/04/2019 05:52   Ct Head Wo Contrast  Result Date: 02/19/2019 CLINICAL DATA:  Posturing. Subdural hematoma. Liver failure, likely coagulopathy. EXAM: CT HEAD WITHOUT CONTRAST TECHNIQUE: Contiguous axial images were obtained from the base of the skull through the vertex without intravenous contrast. COMPARISON:  CT head 01/30/2019. FINDINGS: Brain: Acute LEFT subdural hematoma epicenter posterior frontotemporal convexity, is slightly larger compared with yesterday's scan, 28 mm. There is more central hyperattenuation suggesting interval acute bleeding. There is significant LEFT-to-RIGHT shift, 10-11 mm, now with early obstructive hydrocephalus of the RIGHT lateral ventricle. LEFT temporal horn is also more prominent, and partially trapped. Vascular:  No hyperdense vessel or unexpected calcification. Skull: Normal. Negative for fracture or focal lesion. Sinuses/Orbits: No acute finding. Other: None. IMPRESSION: Worsening LEFT subdural hematoma, a few mm increased thickness compared to yesterday.  Increasing central hyperattenuation suggesting further acute bleeding. LEFT-to-RIGHT shift of 10-11 mm, now with early obstructive hydrocephalus on the RIGHT and enlarging LEFT temporal horn. These results were communicated to Dr. Ronnald Ramp at 9:36 amon 06/29/2020by text page. Electronically Signed   By: Staci Righter M.D.   On: 02/04/2019 09:37   Ct Head Wo Contrast  Result Date: 02/08/2019 CLINICAL DATA:  Altered level of consciousness. EXAM: CT HEAD WITHOUT CONTRAST TECHNIQUE: Contiguous axial images were obtained from the base of the skull through the vertex without intravenous contrast. COMPARISON:  None. FINDINGS: Brain: There is a large acute left-sided subdural hematoma along the left frontoparietal convexity. It measures approximately 2.6 cm in its greatest dimension on the coronal view. There is effacement of the left lateral ventricle with a 6 mm rightward midline shift. Vascular: No hyperdense vessel or unexpected calcification. Skull: Normal. Negative for fracture or focal lesion. Sinuses/Orbits: No acute finding. The patient is status post bilateral cataract surgery. Other: None. IMPRESSION: Large subdural hematoma along the left frontoparietal convexity measuring 2.6 cm in width and causing a 6 mm rightward midline shift. These results were called by telephone at the time of interpretation on 02/06/2019 at 6:53 pm to Dr. Theotis Burrow , who verbally acknowledged these results. Electronically Signed   By: Constance Holster M.D.   On: 02/01/2019 18:58   Ct Chest W Contrast  Result Date: 02/13/2019 CLINICAL DATA:  58 year old female with fall and abdominal pain. History of liver failure. EXAM: CT CHEST, ABDOMEN, AND PELVIS WITH CONTRAST TECHNIQUE: Multidetector CT imaging of the chest, abdomen and pelvis was performed following the standard protocol during bolus administration of intravenous contrast. CONTRAST:  149m OMNIPAQUE IOHEXOL 300 MG/ML  SOLN COMPARISON:  None. FINDINGS: CT CHEST FINDINGS  Cardiovascular: There is no cardiomegaly or pericardial effusion. The thoracic aorta is unremarkable. The origins of the great vessels of the arch appear patent as visualized. The central pulmonary arteries are unremarkable. Mediastinum/Nodes: There is no hilar or mediastinal adenopathy. Esophagus and thyroid gland are grossly unremarkable. No mediastinal fluid collection. Lungs/Pleura: The lungs are clear. There is no pleural effusion or pneumothorax. The central airways are patent. Musculoskeletal: Mild age indeterminate, likely chronic compression deformity of the superior endplates of T2 and T3. No definite acute fracture. CT ABDOMEN PELVIS FINDINGS No intra-abdominal free air or free fluid. Hepatobiliary: Severe fatty infiltration of the liver. No intrahepatic biliary ductal dilatation. Probable sludge within the gallbladder. No calcified stone. No pericholecystic fluid. Pancreas: Unremarkable. No pancreatic ductal dilatation or surrounding inflammatory changes. Spleen: Normal in size without focal abnormality. Adrenals/Urinary Tract: The adrenal glands, kidneys, visualized ureters, and urinary bladder appear unremarkable. Stomach/Bowel: There is mild thickened appearance of the ascending colon and hepatic flexure, likely artifactual. Mild colitis is not excluded. Clinical correlation is recommended. There is no bowel obstruction. The appendix is normal. Vascular/Lymphatic: The abdominal aorta and IVC appear unremarkable. No portal venous gas. There is no adenopathy. Reproductive: Hysterectomy. No pelvic mass. Other: Diffuse subcutaneous edema. No fluid collection. Musculoskeletal: No acute or significant osseous findings. IMPRESSION: 1. No acute intrathoracic pathology. 2. Underdistention of the ascending colon versus less likely mild colitis. Clinical correlation is recommended. No bowel obstruction. Normal appendix. 3. Severe fatty infiltration of the liver. Electronically Signed   By: AAnner CreteM.D.    On:  02/11/2019 22:20   Ct Abdomen Pelvis W Contrast  Result Date: 02/14/2019 CLINICAL DATA:  58 year old female with fall and abdominal pain. History of liver failure. EXAM: CT CHEST, ABDOMEN, AND PELVIS WITH CONTRAST TECHNIQUE: Multidetector CT imaging of the chest, abdomen and pelvis was performed following the standard protocol during bolus administration of intravenous contrast. CONTRAST:  133m OMNIPAQUE IOHEXOL 300 MG/ML  SOLN COMPARISON:  None. FINDINGS: CT CHEST FINDINGS Cardiovascular: There is no cardiomegaly or pericardial effusion. The thoracic aorta is unremarkable. The origins of the great vessels of the arch appear patent as visualized. The central pulmonary arteries are unremarkable. Mediastinum/Nodes: There is no hilar or mediastinal adenopathy. Esophagus and thyroid gland are grossly unremarkable. No mediastinal fluid collection. Lungs/Pleura: The lungs are clear. There is no pleural effusion or pneumothorax. The central airways are patent. Musculoskeletal: Mild age indeterminate, likely chronic compression deformity of the superior endplates of T2 and T3. No definite acute fracture. CT ABDOMEN PELVIS FINDINGS No intra-abdominal free air or free fluid. Hepatobiliary: Severe fatty infiltration of the liver. No intrahepatic biliary ductal dilatation. Probable sludge within the gallbladder. No calcified stone. No pericholecystic fluid. Pancreas: Unremarkable. No pancreatic ductal dilatation or surrounding inflammatory changes. Spleen: Normal in size without focal abnormality. Adrenals/Urinary Tract: The adrenal glands, kidneys, visualized ureters, and urinary bladder appear unremarkable. Stomach/Bowel: There is mild thickened appearance of the ascending colon and hepatic flexure, likely artifactual. Mild colitis is not excluded. Clinical correlation is recommended. There is no bowel obstruction. The appendix is normal. Vascular/Lymphatic: The abdominal aorta and IVC appear unremarkable. No portal  venous gas. There is no adenopathy. Reproductive: Hysterectomy. No pelvic mass. Other: Diffuse subcutaneous edema. No fluid collection. Musculoskeletal: No acute or significant osseous findings. IMPRESSION: 1. No acute intrathoracic pathology. 2. Underdistention of the ascending colon versus less likely mild colitis. Clinical correlation is recommended. No bowel obstruction. Normal appendix. 3. Severe fatty infiltration of the liver. Electronically Signed   By: AAnner CreteM.D.   On: 02/08/2019 22:20   Dg Chest Port 1 View  Result Date: 02/04/2019 CLINICAL DATA:  Mechanical ventilation. EXAM: PORTABLE CHEST 1 VIEW COMPARISON:  02/11/2019 FINDINGS: Endotracheal tube is roughly 1.6 cm above the carina. Nasogastric tube extends into the abdomen but the tip is beyond the image. Again noted are a few densities at the lung bases and most compatible with atelectasis. Upper lungs are clear. Negative for a pneumothorax. Heart size is upper limits of normal and stable. IMPRESSION: 1. Basilar atelectasis with minimal change. 2. Support apparatuses as described. Electronically Signed   By: AMarkus DaftM.D.   On: 02/04/2019 09:38   Dg Chest Portable 1 View  Result Date: 02/08/2019 CLINICAL DATA:  Hypoxia EXAM: PORTABLE CHEST 1 VIEW COMPARISON:  Chest radiograph and chest CT February 02, 2019 FINDINGS: Endotracheal tube tip is 1.8 cm above the carina. Nasogastric tube tip and side port are below the diaphragm. No pneumothorax. There is atelectasis in the left base. The lungs elsewhere are clear. Heart is upper normal in size with pulmonary vascularity normal. No adenopathy. No bone lesions. IMPRESSION: Tube positions as described without pneumothorax. Slight left base atelectasis. No edema or consolidation. Stable cardiac silhouette. Electronically Signed   By: WLowella GripIII M.D.   On: 02/20/2019 08:15   Dg Chest Port 1 View  Result Date: 02/07/2019 CLINICAL DATA:  Shortness of breath EXAM: PORTABLE CHEST 1 VIEW  COMPARISON:  None. FINDINGS: There is focal airspace opacity in the medial right base. There is mild atelectatic change  in the left base. Lungs elsewhere are clear. Heart is upper normal in size with pulmonary vascularity normal. No adenopathy. No bone lesions. IMPRESSION: Focal consolidation medial right base concerning for pneumonia. Left base atelectasis. Lungs elsewhere clear. Heart upper normal in size. Followup PA and lateral chest radiographs recommended in 3-4 weeks following trial of antibiotic therapy to ensure resolution and exclude underlying malignancy. Electronically Signed   By: Lowella Grip III M.D.   On: 02/07/2019 18:44   US Abdomen Limited Ruq  Result Date: 02/20/2019 CLINICAL DATA:  58 year old with acute liver failure.  Jaundice. EXAM: ULTRASOUND ABDOMEN LIMITED RIGHT UPPER QUADRANT COMPARISON:  CT 02/16/2019 FINDINGS: Gallbladder: Echogenic sludge in the gallbladder. Concern for asymmetric gallbladder wall thickening but this apparent wall thickening could also be related to focal fat sparing in the adjacent liver. Reportedly, sonographic Murphy sign cannot be evaluated due to patient's mental status. Common bile duct: Diameter: 0.3 cm Liver: No focal lesion. Diffusely increased echogenicity is suggestive for hepatic steatosis. Limited evaluation of the internal architecture due to the steatosis. No intrahepatic biliary dilatation. Portal vein is patent on color Doppler imaging with normal direction of blood flow towards the liver. IMPRESSION: 1. Hepatic steatosis.  No focal liver lesion. 2. Gallbladder sludge and questionable asymmetric gallbladder wall thickening. No biliary dilatation. Electronically Signed   By: Markus Daft M.D.   On: 02/12/2019 18:45   Impression:  1.  Altered mental status, persists. 2.  Profound hyperbilirubinemia with much less elevated other liver function tests.  I am not sure this is liver failure, but assessment under current patient condition  (intubation, minimally arousable) is difficult.  Relative normal INR argues at least against progressive liver failure.  No evidence of biliary obstruction. 3.  Subdural hematoma, evacuated, with newly developing parenchymal bleed.  Plan:  1.  Not clear whether patient is having progressive liver failure, but as above relatively normal INR (as well as lack of progressive renal dysfunction) at least argues against worsening liver disease.   2.  Etiology of hyperbilirubinemia is unclear; multiple serologies are pending; pending those we could at least consider steroids +/- mucomyst if liver dysfunction worsens. 3.  In patient's current state she is not a liver transplant candidate, and as such, would pursue empiric medical therapy (lactulose; possible steroids or mucomyst if bilirubin worsens) and don't see how liver biopsy in patient's current state would be safe or feasible. 4.  Follow daily liver enzymes and INR and electrolytes/renal function.  Serial ammonia measures likely not going to be helpful to guide management. 5.  [facility] will follow at a distance; please call with any further questions.   LOS: 1 day   Jamarian Jacinto M  02/04/2019, 10:40 AM  Cell [phone] If no answer or after 5 PM call [phone]

## 2019-02-04 NOTE — Progress Notes (Signed)
NAME:  Taylor Robinson, MRN:  [id number], DOB:  07-Aug-1961, LOS: 1 ADMISSION DATE:  02/23/2019, CONSULTATION DATE:  02/24/2019 REFERRING MD:  Dr. 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  Neurosurgery does not feel that emergent surgery is warranted at this time.  PCCM consulted for admit but with recommendations for early transfer to transplant center.   History of present illness   HPI obtained from medical chart review as patient is encephalopathic.   58 year old female with history of obesity, thyroid problems, GERD, and DMT2 presenting from home with worsening jaundice over last 1-2 weeks, falls, and increasing confusion/ lethargy today.   Daughter had provided some history in ER.  Reportedly been more sedentary recently with problems of coughing spells leading into vomiting when attempting to eat with associated abdominal pain patient related to multiple episodes of vomiting.  No reported fevers, alcohol abuse, or known history of liver problems.    In ER, afebrile, normotensive and normal oxygen saturations but remains altered but protecting her airway.  Workup noted for Na 116, K 3.3, CL 79, glucose 115, sCr 1.26, alk phos 241, AST 173, ALT 56, t. Bili 31.4, direct bili 19.6, INR 1.4, ammonia 68,  WBC 11.2, Hgb 11.1, Platelets 188, tylenol level neg, Lactic 3 -> 1.8, UA and UDS pending VBG 7.407/ 37/ 32/ 23.4, CXR showing RLL opacity and left lower atelectasis.  CT head showed acute on chronic large left frontoparietal SDH with 6 mm rightward midline shift.  CT chest and abd/ pelvis ordered but not performed yet.  Neurosurgery consulted.  PCCM consulted for admit.   Past Medical History  Thyroid problems, GERD, DMT2  Significant Hospital Events    Consults:  NSGY  Procedures:   Significant Diagnostic Tests:  6/5 CTH >> Large subdural hematoma along the left frontoparietal convexity measuring 2.6 cm in width and causing a 6 mm rightward midline shift.  6/5 CT abd/pelvis 6/5  CT chest   Micro Data:  6/5 SARS coronavirus 2 >> neg 6/5 BCx 2 >> 6/5 UC >>  Antimicrobials:   Interim history/subjective:   Critical care called 6/6 due to ongoing neurologic decline in the ER. To OR with L craniotomy for SDH. CT head early 6/7 a.m. reveals improvement after evacuation, new focus of bleeding. D/W Dr. Ronnald Ramp from [facility], just continue supportive care for now, currently non-surgical. Pt. Remains intubated, nonresponsive.   Objective   Blood pressure (!) 127/55, pulse 84, temperature (!) 101.6 F (38.7 C), temperature source Axillary, resp. rate 18, height '5\' 6"'  (1.676 m), weight 103.1 kg, SpO2 98 %.    Vent Mode: PRVC FiO2 (%):  [30 %-50 %] 30 % Set Rate:  [16 bmp] 16 bmp Vt Set:  [470 mL] 470 mL PEEP:  [5 cmH20] 5 cmH20 Plateau Pressure:  [19 cmH20-24 cmH20] 19 cmH20   Intake/Output Summary (Last 24 hours) at 02/04/2019 1026 Last data filed at 02/04/2019 0700 Gross per 24 hour  Intake 2858.92 ml  Output 1300 ml  Net 1558.92 ml   Filed Weights   02/13/2019 0800 02/04/19 0530  Weight: 97.5 kg 103.1 kg  Examination: General: Obese chronically ill-appearing female intubated on mechanical ventilator support HEENT: Mucous membranes dry, right pupil 2 mm left pupil 3 mm, right sluggishly responsive to light, left nonresponsive to light, icteric sclera Neuro: She will withdrawal to pain, has a posturing-like movement, no response to voice; 3-4 beat clonus in ankles, wrists CV: Regular rate and rhythm, no r/m/g PULM: Bilateral ventilated breath sounds, CTA GI: Soft, nontender mildly distended,  obese pannus Extremities: Bilateral lower extremity edema Skin: Skin breakdown within skin folds, severe jaundice scattered excoriations and bruising  Images below documented from initial consultation on 02/05/2019:  left groin   Right groin   Right breast fold   Resolved Hospital Problem list    Assessment & Plan:    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 -currently breathing over vent settings, alk. ABG noted  PLAN:  -continue mechanical ventilation -VAP bundle.   Acute liver failure - Coagulopathy and jaundice, hyperbilirubinemia - Unclear etiology of liver failure - new medications within the last week, sitagliptin (0.5% risk liver injury) and ibuprofen 865m (rare as well, but usually occurs within 1-3 weeks of initiation)  -CT imaging with severe fatty infiltration of the liver - Hepatitis A, B and C negative  -Tylenol level negative - Direct bilirubin 19, total bilirubin 31 on admission - consult GI for their input; spoke with them today, will continue supportive care -I do believe with her current medical comorbidities that her overall outcome is poor  Acute metabolic encephalopathy Elevated ammonia, hyperammonemia Hepatic encephalopathy - We will start treatment with lactulose; continue to follow as was climbing 6/6  Acute renal failure Hyponatremia, hypervolemic on exam - Multifactorial, possibly related to acute liver failure.  Elevated troponin - secondary to multisystem failure as above  - marker of severity of illness   Lactic acidosis Anion gap metabolic acidosis - Likely type B lactic acidosis from inability of lactate clearance due to liver failure  Diabetes type 2  -CBGs with SSI  Goals of care: Dr. IValeta Harmscalled 6/6 and spoke with the patient's daughter ACristie Hem telephone #[phone]  She also has a younger brother who is 140years old.  They are currently making medical decisions for the patient.  Upon further discussion with her she does state that her mother is married.  They are separated but not legally divorced.  Her husband is at [employer]  And the daughter has attempted to reach out to him this morning but there has been no answer.  His telephone number is [phone].  I have attempted to call the number provided by his daughter to reach RHectorand there was no answer.  Best practice:   Labs   CBC: Recent Labs  Lab 02/25/2019 1749 02/17/2019 1807 02/23/2019 1033 01/31/2019 1434 02/04/19 0444  WBC 11.2*  --   --  8.7 9.9  NEUTROABS 9.4*  --   --  7.0  --   HGB 11.1* 18.0* 10.5* 8.8* 8.3*  HCT 31.2* 53.0* 31.0* 24.6* 22.6*  MCV 93.4  --   --  92.8 91.9  PLT 188  --   --  165 146*    Basic Metabolic Panel: Recent Labs  Lab 02/23/2019 1749  02/08/2019 0740  02/01/2019 1033 02/25/2019 1434 02/18/2019 1826 02/04/19 0444  NA 116*   < > 118* 117* 119* 117* 122*  K 3.3*   < > 3.5 3.4* 3.0* 3.2* 3.5  CL 79*  --  80*  --  82* 83* 87*  CO2 23  --  21*  --  21* 22 21*  GLUCOSE 115*  --  109*  --  115* 133* 132*  BUN 13  --  17  --  '17 17 16  ' CREATININE 1.26*  --  1.47*  --  1.33* 1.34* 1.26*  CALCIUM 8.4*  --  8.3*  --  7.9* 7.6* 7.8*  MG  --   --   --   --  2.0  --  2.0  PHOS  --   --   --   --  3.3  --  2.7   < > = values in this interval not displayed.   GFR: Estimated Creatinine Clearance: 61.2 mL/min (A) (by C-G formula based on SCr of 1.26 mg/dL (H)). Recent Labs  Lab 02/11/2019 1749 02/08/2019 2010 02/19/2019 1434 02/04/19 0444  WBC 11.2*  --  8.7 9.9  LATICACIDVEN 3.0* 1.8  --   --     Liver Function Tests: Recent Labs  Lab 02/16/2019 1749 02/23/2019 0740 02/22/2019 1434 02/09/2019 1826 02/04/19 0444   AST 173* 163* 136* 136* 135*  ALT 56* 51* 41 39 35  ALKPHOS 241* 234* 202* 204* 203*  BILITOT 31.4* 28.7* 26.6* 28.0* 26.4*  PROT 5.6* 5.4* 5.5* 5.3* 5.1*  ALBUMIN 2.2* 2.0* 2.2* 2.0* 1.9*   Recent Labs  Lab 02/14/2019 1749  LIPASE 46   Recent Labs  Lab 02/12/2019 1749 02/11/2019 1826  AMMONIA 68* 121*    ABG    Component Value Date/Time   PHART 7.500 (H) 02/04/2019 0350   PCO2ART 30.1 (L) 02/04/2019 0350   PO2ART 121 (H) 02/04/2019 0350   HCO3 23.0 02/04/2019 0350   TCO2 25 02/26/2019 1033   ACIDBASEDEF 1.0 02/04/2019 1807   O2SAT 98.6 02/04/2019 0350     Coagulation Profile: Recent Labs  Lab 02/23/2019 1749 02/18/2019 1105 02/11/2019 1826 02/04/19 0444  INR 1.4* 1.4* 1.4* 1.3*    Cardiac Enzymes: Recent Labs  Lab 01/31/2019 1749  TROPONINI 0.06*    HbA1C: No results found for: HGBA1C  CBG: Recent Labs  Lab 01/31/2019 1605 02/09/2019 1926 02/19/2019 2330 02/04/19 0325 02/04/19 0800  GLUCAP 108* 141* 124* 128* 132*    Review of Systems:   unable  Past Medical History  She,  has a past medical history of Diabetes mellitus without complication ([place]).   Surgical History   History reviewed. No pertinent surgical history.   Social History      Family History   Her family history is not on file.   Allergies No Known Allergies   Home Medications  Prior to Admission medications   Not on File   As of June 1st:  New Prescriptions  Instructions  fluticasone propionate 50 mcg/actuation nasal spray Commonly known as: FLONASE Started by: Lilia Pro, MD 2 sprays, Nasal, Daily  ibuprofen 800 mg tablet Commonly known as: ADVIL,MOTRIN Started by: Lilia Pro, MD 800 mg, Oral, Every 8 hours as needed  sitaGLIPtin 100 mg tablet Commonly known as: JANUVIA Started by: Lilia Pro, MD 100 mg, Oral, Daily   Continued Medications  Instructions  cetirizine 10 mg tablet Commonly known as: ZYRTEC 10 mg, Daily  DHEA 25 MG Caps 1 capsule,  Daily  hydrOXYzine HCl 10 mg tablet Commonly known as: ATARAX TAKE 1 TABLET BY MOUTH EVERY 8 HOURS AS NEEDED FOR ITCHING  levothyroxine sodium 25 mcg tablet Commonly known as: SYNTHROID,LOVOTHROID,LEVOXYL TAKE 1 TABLET BY MOUTH EVERY DAY EXCEPT TAKE ONE AND ONE-HALF TABLET BY MOUTH ON SUNDAY  metFORMIN 500 MG tablet Commonly known as: GLUCOPHAGE TAKE ONE TABLET BY MOUTH ONE TIME DAILY WITH BREAKFAST  montelukast 10 MG tablet Commonly known as: SINGULAIR TAKE 1 TABLET BY MOUTH EVERYDAY AT BEDTIME  NaCl 0.9 % SOLN 50 mL with ondansetron 40 MG/20ML SOLN 8 mg, IntraVENous, Once  omeprazole 20 mg capsule Commonly known as: PRILOSEC TAKE 1 CAPSULE BY MOUTH EVERY DAY  ondansetron 8 mg disintegrating tablet Commonly known as: ZOFRAN-ODT 8 mg, Oral, Every 8 hours as needed  SYMBICORT 160-4.5 mcg/actuation inhaler Generic drug: budesonide-formoterol TAKE 2 PUFFS BY MOUTH TWICE A DAY  THERA Tabs 1 tablet, Oral, Daily  VENTOLIN HFA 108 (90 Base) MCG/ACT inhaler Generic drug: albuterol sulfate HFA INHALE 2 PUFFS INTO THE LUNGS EVERY 4 (FOUR) HOURS AS NEEDED FOR WHEEZING.    This patient is critically ill with multiple organ system failure; which, requires frequent high complexity decision making, assessment, support, evaluation, and titration of therapies. This was completed through the application of advanced monitoring technologies and extensive interpretation of multiple databases. During this encounter critical care time was devoted to patient care services described in this note for 55 minutes.   Bonna Gains, MD, PhD  02/04/2019 10:40 AM

## 2019-02-04 NOTE — Progress Notes (Signed)
Patient ID: Taylor Robinson, female   DOB: [birth year], 58 y.o.   MRN: [id number] Subjective: Patient remains intubated and sedated.  Objective: Vital signs in last 24 hours: Temp:  [99.4 F (37.4 C)-100.6 F (38.1 C)] 100.6 F (38.1 C) (06/07 0400) Pulse Rate:  [74-112] 85 (06/07 0700) Resp:  [17-29] 19 (06/07 0700) BP: (101-197)/(48-99) 127/58 (06/07 0700) SpO2:  [94 %-100 %] 98 % (06/07 0700) Arterial Line BP: (166-190)/(50-66) 177/52 (06/07 0700) FiO2 (%):  [30 %-100 %] 30 % (06/07 0343) Weight:  [97.5 kg-103.1 kg] 103.1 kg (06/07 0530)  Intake/Output from previous day: 06/06 0701 - 06/07 0700 In: 2858.9 [I.V.:1890; Blood:621; IV Piggyback:347.9] Out: 1300 [Urine:815; Drains:125; Stool:310; Blood:50] Intake/Output this shift: No intake/output data recorded.  She remains intubated and sedated.  Left pupil is 4 mm and sluggish, right pupil is 2 mm and sluggish, she withdraws bilaterally to pain, she appears less jaundiced this morning, Candiss Norse is saturated and was removed but the incision looks okay.  JP drain putting out bloody CSF, Hemovac putting out nothing.  Lab Results: Lab Results  Component Value Date   WBC 9.9 02/04/2019   HGB 8.3 (L) 02/04/2019   HCT 22.6 (L) 02/04/2019   MCV 91.9 02/04/2019   PLT 146 (L) 02/04/2019   Lab Results  Component Value Date   INR 1.3 (H) 02/04/2019   BMET Lab Results  Component Value Date   NA 122 (L) 02/04/2019   K 3.5 02/04/2019   CL 87 (L) 02/04/2019   CO2 21 (L) 02/04/2019   GLUCOSE 132 (H) 02/04/2019   BUN 16 02/04/2019   CREATININE 1.26 (H) 02/04/2019   CALCIUM 7.8 (L) 02/04/2019    Studies/Results: Ct Head Wo Contrast  Result Date: 02/04/2019 CLINICAL DATA:  Intracranial hemorrhage follow up EXAM: CT HEAD WITHOUT CONTRAST TECHNIQUE: Contiguous axial images were obtained from the base of the skull through the vertex without intravenous contrast. COMPARISON:  March 02, 2019 FINDINGS: Brain: There is new, acute intraparenchymal  hemorrhage within the left temporal lobe, measuring 3.4 x 2.8 x 2.5 cm (volume = 12 cm^3). There is small amount of overlying subarachnoid blood. There is moderate edema within the left temporal lobe. Patient is status post evacuation of a left-sided subdural hematoma. There is a subdural drainage catheter along the left convexity. Midline shift has substantially resolved. No hydrocephalus. Vascular: No abnormal hyperdensity of the major intracranial arteries or dural venous sinuses. No intracranial atherosclerosis. Skull: Status post left parietal craniotomy. Sinuses/Orbits: No fluid levels or advanced mucosal thickening of the visualized paranasal sinuses. No mastoid or middle ear effusion. The orbits are normal. IMPRESSION: 1. Acute intraparenchymal hematoma within the left temporal lobe with volume of 12 mL and moderate surrounding edema. Small amount of overlying subarachnoid blood. 2. Status post evacuation of left hemispheric subdural hematoma with resolution of midline shift. Critical Value/emergent results were called by telephone at the time of interpretation on 02/04/2019 at 5:52 am to Ezra Sites, NP , who verbally acknowledged these results. Electronically Signed   By: Ulyses Jarred M.D.   On: 02/04/2019 05:52   Ct Head Wo Contrast  Result Date: 02/01/2019 CLINICAL DATA:  Posturing. Subdural hematoma. Liver failure, likely coagulopathy. EXAM: CT HEAD WITHOUT CONTRAST TECHNIQUE: Contiguous axial images were obtained from the base of the skull through the vertex without intravenous contrast. COMPARISON:  CT head 03/02/2019. FINDINGS: Brain: Acute LEFT subdural hematoma epicenter posterior frontotemporal convexity, is slightly larger compared with yesterday's scan, 28 mm. There is more central hyperattenuation suggesting interval acute  bleeding. There is significant LEFT-to-RIGHT shift, 10-11 mm, now with early obstructive hydrocephalus of the RIGHT lateral ventricle. LEFT temporal horn is also more  prominent, and partially trapped. Vascular: No hyperdense vessel or unexpected calcification. Skull: Normal. Negative for fracture or focal lesion. Sinuses/Orbits: No acute finding. Other: None. IMPRESSION: Worsening LEFT subdural hematoma, a few mm increased thickness compared to yesterday. Increasing central hyperattenuation suggesting further acute bleeding. LEFT-to-RIGHT shift of 10-11 mm, now with early obstructive hydrocephalus on the RIGHT and enlarging LEFT temporal horn. These results were communicated to Dr. Yetta BarreJones at 9:36 amon 06/29/2020by text page. Electronically Signed   By: Elsie StainJohn T Curnes M.D.   On: 02/01/2019 09:37   Ct Head Wo Contrast  Result Date: 02/16/2019 CLINICAL DATA:  Altered level of consciousness. EXAM: CT HEAD WITHOUT CONTRAST TECHNIQUE: Contiguous axial images were obtained from the base of the skull through the vertex without intravenous contrast. COMPARISON:  None. FINDINGS: Brain: There is a large acute left-sided subdural hematoma along the left frontoparietal convexity. It measures approximately 2.6 cm in its greatest dimension on the coronal view. There is effacement of the left lateral ventricle with a 6 mm rightward midline shift. Vascular: No hyperdense vessel or unexpected calcification. Skull: Normal. Negative for fracture or focal lesion. Sinuses/Orbits: No acute finding. The patient is status post bilateral cataract surgery. Other: None. IMPRESSION: Large subdural hematoma along the left frontoparietal convexity measuring 2.6 cm in width and causing a 6 mm rightward midline shift. These results were called by telephone at the time of interpretation on 02/18/2019 at 6:53 pm to Dr. Frederick PeersACHEL LITTLE , who verbally acknowledged these results. Electronically Signed   By: Katherine Mantlehristopher  Green M.D.   On: 02/22/2019 18:58   Ct Chest W Contrast  Result Date: 02/18/2019 CLINICAL DATA:  58 year old female with fall and abdominal pain. History of liver failure. EXAM: CT CHEST, ABDOMEN, AND  PELVIS WITH CONTRAST TECHNIQUE: Multidetector CT imaging of the chest, abdomen and pelvis was performed following the standard protocol during bolus administration of intravenous contrast. CONTRAST:  100mL OMNIPAQUE IOHEXOL 300 MG/ML  SOLN COMPARISON:  None. FINDINGS: CT CHEST FINDINGS Cardiovascular: There is no cardiomegaly or pericardial effusion. The thoracic aorta is unremarkable. The origins of the great vessels of the arch appear patent as visualized. The central pulmonary arteries are unremarkable. Mediastinum/Nodes: There is no hilar or mediastinal adenopathy. Esophagus and thyroid gland are grossly unremarkable. No mediastinal fluid collection. Lungs/Pleura: The lungs are clear. There is no pleural effusion or pneumothorax. The central airways are patent. Musculoskeletal: Mild age indeterminate, likely chronic compression deformity of the superior endplates of T2 and T3. No definite acute fracture. CT ABDOMEN PELVIS FINDINGS No intra-abdominal free air or free fluid. Hepatobiliary: Severe fatty infiltration of the liver. No intrahepatic biliary ductal dilatation. Probable sludge within the gallbladder. No calcified stone. No pericholecystic fluid. Pancreas: Unremarkable. No pancreatic ductal dilatation or surrounding inflammatory changes. Spleen: Normal in size without focal abnormality. Adrenals/Urinary Tract: The adrenal glands, kidneys, visualized ureters, and urinary bladder appear unremarkable. Stomach/Bowel: There is mild thickened appearance of the ascending colon and hepatic flexure, likely artifactual. Mild colitis is not excluded. Clinical correlation is recommended. There is no bowel obstruction. The appendix is normal. Vascular/Lymphatic: The abdominal aorta and IVC appear unremarkable. No portal venous gas. There is no adenopathy. Reproductive: Hysterectomy. No pelvic mass. Other: Diffuse subcutaneous edema. No fluid collection. Musculoskeletal: No acute or significant osseous findings.  IMPRESSION: 1. No acute intrathoracic pathology. 2. Underdistention of the ascending colon versus less likely mild  colitis. Clinical correlation is recommended. No bowel obstruction. Normal appendix. 3. Severe fatty infiltration of the liver. Electronically Signed   By: Elgie CollardArash  Radparvar M.D.   On: 02/17/2019 22:20   Ct Abdomen Pelvis W Contrast  Result Date: 01/31/2019 CLINICAL DATA:  58 year old female with fall and abdominal pain. History of liver failure. EXAM: CT CHEST, ABDOMEN, AND PELVIS WITH CONTRAST TECHNIQUE: Multidetector CT imaging of the chest, abdomen and pelvis was performed following the standard protocol during bolus administration of intravenous contrast. CONTRAST:  100mL OMNIPAQUE IOHEXOL 300 MG/ML  SOLN COMPARISON:  None. FINDINGS: CT CHEST FINDINGS Cardiovascular: There is no cardiomegaly or pericardial effusion. The thoracic aorta is unremarkable. The origins of the great vessels of the arch appear patent as visualized. The central pulmonary arteries are unremarkable. Mediastinum/Nodes: There is no hilar or mediastinal adenopathy. Esophagus and thyroid gland are grossly unremarkable. No mediastinal fluid collection. Lungs/Pleura: The lungs are clear. There is no pleural effusion or pneumothorax. The central airways are patent. Musculoskeletal: Mild age indeterminate, likely chronic compression deformity of the superior endplates of T2 and T3. No definite acute fracture. CT ABDOMEN PELVIS FINDINGS No intra-abdominal free air or free fluid. Hepatobiliary: Severe fatty infiltration of the liver. No intrahepatic biliary ductal dilatation. Probable sludge within the gallbladder. No calcified stone. No pericholecystic fluid. Pancreas: Unremarkable. No pancreatic ductal dilatation or surrounding inflammatory changes. Spleen: Normal in size without focal abnormality. Adrenals/Urinary Tract: The adrenal glands, kidneys, visualized ureters, and urinary bladder appear unremarkable. Stomach/Bowel: There  is mild thickened appearance of the ascending colon and hepatic flexure, likely artifactual. Mild colitis is not excluded. Clinical correlation is recommended. There is no bowel obstruction. The appendix is normal. Vascular/Lymphatic: The abdominal aorta and IVC appear unremarkable. No portal venous gas. There is no adenopathy. Reproductive: Hysterectomy. No pelvic mass. Other: Diffuse subcutaneous edema. No fluid collection. Musculoskeletal: No acute or significant osseous findings. IMPRESSION: 1. No acute intrathoracic pathology. 2. Underdistention of the ascending colon versus less likely mild colitis. Clinical correlation is recommended. No bowel obstruction. Normal appendix. 3. Severe fatty infiltration of the liver. Electronically Signed   By: Elgie CollardArash  Radparvar M.D.   On: 02/07/2019 22:20   Dg Chest Portable 1 View  Result Date: 02/19/2019 CLINICAL DATA:  Hypoxia EXAM: PORTABLE CHEST 1 VIEW COMPARISON:  Chest radiograph and chest CT February 02, 2019 FINDINGS: Endotracheal tube tip is 1.8 cm above the carina. Nasogastric tube tip and side port are below the diaphragm. No pneumothorax. There is atelectasis in the left base. The lungs elsewhere are clear. Heart is upper normal in size with pulmonary vascularity normal. No adenopathy. No bone lesions. IMPRESSION: Tube positions as described without pneumothorax. Slight left base atelectasis. No edema or consolidation. Stable cardiac silhouette. Electronically Signed   By: Bretta BangWilliam  Woodruff III M.D.   On: 02/22/2019 08:15   Dg Chest Port 1 View  Result Date: 02/09/2019 CLINICAL DATA:  Shortness of breath EXAM: PORTABLE CHEST 1 VIEW COMPARISON:  None. FINDINGS: There is focal airspace opacity in the medial right base. There is mild atelectatic change in the left base. Lungs elsewhere are clear. Heart is upper normal in size with pulmonary vascularity normal. No adenopathy. No bone lesions. IMPRESSION: Focal consolidation medial right base concerning for pneumonia.  Left base atelectasis. Lungs elsewhere clear. Heart upper normal in size. Followup PA and lateral chest radiographs recommended in 3-4 weeks following trial of antibiotic therapy to ensure resolution and exclude underlying malignancy. Electronically Signed   By: Bretta BangWilliam  Woodruff III M.D.  On: 2018-11-03 18:44   [place] Abdomen Limited Ruq  Result Date: 02/21/2019 CLINICAL DATA:  58 year old with acute liver failure.  Jaundice. EXAM: ULTRASOUND ABDOMEN LIMITED RIGHT UPPER QUADRANT COMPARISON:  CT 2018-11-03 FINDINGS: Gallbladder: Echogenic sludge in the gallbladder. Concern for asymmetric gallbladder wall thickening but this apparent wall thickening could also be related to focal fat sparing in the adjacent liver. Reportedly, sonographic Murphy sign cannot be evaluated due to patient's mental status. Common bile duct: Diameter: 0.3 cm Liver: No focal lesion. Diffusely increased echogenicity is suggestive for hepatic steatosis. Limited evaluation of the internal architecture due to the steatosis. No intrahepatic biliary dilatation. Portal vein is patent on color Doppler imaging with normal direction of blood flow towards the liver. IMPRESSION: 1. Hepatic steatosis.  No focal liver lesion. 2. Gallbladder sludge and questionable asymmetric gallbladder wall thickening. No biliary dilatation. Electronically Signed   By: Richarda OverlieAdam  Henn M.D.   On: 02/04/2019 18:45    Assessment/Plan: CT scan reviewed and shows a posterior temporal hemorrhage consistent with either hemorrhagic infarct or contusion.  Do not believe that this needs surgical evacuation.  Much less mass-effect than preop.  The subdural hematoma is gone.  Continue supportive care.  We will repeat CT scan in a few days.  Estimated body mass index is 36.69 kg/m as calculated from the following:   Height as of this encounter: 5\' 6"  (1.676 m).   Weight as of this encounter: 103.1 kg.    LOS: 1 day    Tia AlertDavid S Tranesha Lessner 02/04/2019, 7:25 AM

## 2019-02-04 NOTE — Progress Notes (Signed)
Updated husband by phone

## 2019-02-04 NOTE — Progress Notes (Signed)
eLink Physician-Brief Progress Note Patient Name: Taylor Robinson DOB: 12-22-1960 MRN: [id number]   Date of Service  02/04/2019  HPI/Events of Note  Oliguria.   eICU Interventions  Will order: 1. Bolus with 0.9 NaCl 1 liter IV over 1 hour now.      Intervention Category Intermediate Interventions: Oliguria - evaluation and management  Sommer,Steven Eugene 02/04/2019, 8:06 PM

## 2019-02-04 NOTE — Progress Notes (Signed)
[place] Progress Note Patient Name: Vernessa Likes DOB: 1960/10/13 MRN: [id number]   Date of Service  02/04/2019  HPI/Events of Note  Agitation - Request for L soft wrist restraint.   eICU Interventions  Will order: 1. L soft wrist restraint.      Intervention Category Major Interventions: Delirium, psychosis, severe agitation - evaluation and management  Sommer,Steven Eugene 02/04/2019, 8:47 PM

## 2019-02-04 NOTE — Progress Notes (Signed)
Drsg to head saturated with blood. New drsg applied.

## 2019-02-04 NOTE — Consult Note (Signed)
[facility] Nurse wound consult note Reason for Consult: Consulted for moisture associated skin damage in the intertriginous skin folds, specifically the right inframammary and bilateral inguinal areas. See photos taken by Dr. Maryjean Ka dated 6/5 but in his note dated 6/7. The areas are both full and partial thickness, so additional etiologies of skin damage are suspected to be friction and infection Wound type:Moisture Pressure Injury POA: N/A Wound bed: Red, circular lesions in inframammary area, left groin is > right groin and are with linear skin erosions. Drainage (amount, consistency, odor)  Periwound: Jaundiced, intact, dry Dressing procedure/placement/frequency: Will begin topical care with cleansing using tepid tap water or NS and gently patting dry. Topical care will address goals of wicking of moisture and donation of antimicrobial properties of silver using our house antimicrobial textile, InterDry Ag. Instructions for use have been provided in the Orders, but are also here, for convenience.   Measure and cut length of InterDry Ag+ to fit in skin folds that have skin breakdown Tuck InterDry  Ag+ fabric into skin folds in a single layer, allow for 2 inches of overhang from skin edges to allow for wicking to occur May remove to bathe; dry area thoroughly and then tuck into affected areas again Do not apply any creams or ointments when using InterDry Ag+ DO NOT THROW AWAY FOR 5 DAYS unless soiled with stool DO NOT [facility] product, this will inactivate the silver in the material  New sheet of Interdry Ag+ should be applied after 5 days of use if patient continues to have skin breakdown   Discussed POC with bedside RN, Levada Dy. Four (4) packages of InterDry Ag to be delivered to bedside.  [place] nursing team will not follow, but will remain available to this patient, the nursing and medical teams.  Please re-consult if needed. Thanks, Maudie Flakes, MSN, RN, [place], Arther Abbott  Pager# [phone]

## 2019-02-05 ENCOUNTER — Encounter (HOSPITAL_COMMUNITY): Payer: Self-pay | Admitting: Neurological Surgery

## 2019-02-05 DIAGNOSIS — J9600 Acute respiratory failure, unspecified whether with hypoxia or hypercapnia: Secondary | ICD-10-CM

## 2019-02-05 DIAGNOSIS — G9340 Encephalopathy, unspecified: Secondary | ICD-10-CM

## 2019-02-05 LAB — BLOOD GAS, ARTERIAL
Acid-Base Excess: 0.3 mmol/L (ref 0.0–2.0)
Bicarbonate: 23 mmol/L (ref 20.0–28.0)
Drawn by: 33100
FIO2: 30
O2 Saturation: 98.6 %
PEEP: 5 cmH2O
Patient temperature: 100.6
RATE: 16 resp/min
pCO2 arterial: 30.1 mmHg — ABNORMAL LOW (ref 32.0–48.0)
pH, Arterial: 7.5 — ABNORMAL HIGH (ref 7.350–7.450)
pO2, Arterial: 121 mmHg — ABNORMAL HIGH (ref 83.0–108.0)

## 2019-02-05 LAB — COMPREHENSIVE METABOLIC PANEL
ALT: 23 U/L (ref 0–44)
AST: 142 U/L — ABNORMAL HIGH (ref 15–41)
Albumin: 1.7 g/dL — ABNORMAL LOW (ref 3.5–5.0)
Alkaline Phosphatase: 219 U/L — ABNORMAL HIGH (ref 38–126)
Anion gap: 13 (ref 5–15)
BUN: 28 mg/dL — ABNORMAL HIGH (ref 6–20)
CO2: 19 mmol/L — ABNORMAL LOW (ref 22–32)
Calcium: 7.4 mg/dL — ABNORMAL LOW (ref 8.9–10.3)
Chloride: 94 mmol/L — ABNORMAL LOW (ref 98–111)
Creatinine, Ser: 1.57 mg/dL — ABNORMAL HIGH (ref 0.44–1.00)
GFR calc Af Amer: 42 mL/min — ABNORMAL LOW (ref >60)
GFR calc non Af Amer: 36 mL/min — ABNORMAL LOW (ref >60)
Glucose, Bld: 142 mg/dL — ABNORMAL HIGH (ref 70–99)
Potassium: 3.6 mmol/L (ref 3.5–5.1)
Sodium: 126 mmol/L — ABNORMAL LOW (ref 135–145)
Total Bilirubin: 27.7 mg/dL (ref 0.3–1.2)
Total Protein: 5 g/dL — ABNORMAL LOW (ref 6.5–8.1)

## 2019-02-05 LAB — GLUCOSE, CAPILLARY
Glucose-Capillary: 144 mg/dL — ABNORMAL HIGH (ref 70–99)
Glucose-Capillary: 139 mg/dL — ABNORMAL HIGH (ref 70–99)
Glucose-Capillary: 145 mg/dL — ABNORMAL HIGH (ref 70–99)
Glucose-Capillary: 141 mg/dL — ABNORMAL HIGH (ref 70–99)
Glucose-Capillary: 138 mg/dL — ABNORMAL HIGH (ref 70–99)
Glucose-Capillary: 149 mg/dL — ABNORMAL HIGH (ref 70–99)

## 2019-02-05 LAB — CBC WITH DIFFERENTIAL/PLATELET
Abs Immature Granulocytes: 1.72 10*3/uL — ABNORMAL HIGH (ref 0.00–0.07)
Basophils Absolute: 0 10*3/uL (ref 0.0–0.1)
Basophils Relative: 0 %
Eosinophils Absolute: 0 10*3/uL (ref 0.0–0.5)
Eosinophils Relative: 0 %
HCT: 21.3 % — ABNORMAL LOW (ref 36.0–46.0)
Hemoglobin: 7.4 g/dL — ABNORMAL LOW (ref 12.0–15.0)
Immature Granulocytes: 13 %
Lymphocytes Relative: 8 %
Lymphs Abs: 1.1 10*3/uL (ref 0.7–4.0)
MCH: 32.9 pg (ref 26.0–34.0)
MCHC: 34.7 g/dL (ref 30.0–36.0)
MCV: 94.7 fL (ref 80.0–100.0)
Monocytes Absolute: 1 10*3/uL (ref 0.1–1.0)
Monocytes Relative: 8 %
Neutro Abs: 9.4 10*3/uL — ABNORMAL HIGH (ref 1.7–7.7)
Neutrophils Relative %: 71 %
Platelets: 153 10*3/uL (ref 150–400)
RBC: 2.25 MIL/uL — ABNORMAL LOW (ref 3.87–5.11)
RDW: 14.6 % (ref 11.5–15.5)
WBC: 13.2 10*3/uL — ABNORMAL HIGH (ref 4.0–10.5)
nRBC: 1.3 % — ABNORMAL HIGH (ref 0.0–0.2)

## 2019-02-05 LAB — ANTI-SMOOTH MUSCLE ANTIBODY, IGG: F-Actin IgG: 4 Units (ref 0–19)

## 2019-02-05 LAB — IGG 4: IgG, Subclass 4: 61 mg/dL (ref 2–96)

## 2019-02-05 LAB — AMMONIA: Ammonia: 53 umol/L — ABNORMAL HIGH (ref 9–35)

## 2019-02-05 LAB — PATHOLOGIST SMEAR REVIEW

## 2019-02-05 LAB — TRIGLYCERIDES: Triglycerides: 373 mg/dL — ABNORMAL HIGH (ref <150)

## 2019-02-05 LAB — PROTIME-INR
INR: 1.2 (ref 0.8–1.2)
Prothrombin Time: 15.5 seconds — ABNORMAL HIGH (ref 11.4–15.2)

## 2019-02-05 LAB — ANA W/REFLEX IF POSITIVE: Anti Nuclear Antibody (ANA): NEGATIVE

## 2019-02-05 LAB — CREATININE, URINE, RANDOM: Creatinine, Urine: 139.24 mg/dL

## 2019-02-05 LAB — SODIUM, URINE, RANDOM: Sodium, Ur: <10 mmol/L

## 2019-02-05 MED ORDER — ALBUMIN HUMAN 5 % IV SOLN
12.5000 g | Freq: Four times a day (QID) | INTRAVENOUS | Status: AC
  Administered 2019-02-05 (×2): 12.5 g via INTRAVENOUS
  Filled 2019-02-05 (×2): qty 250

## 2019-02-05 MED ORDER — FENTANYL CITRATE (PF) 100 MCG/2ML IJ SOLN
50.0000 ug | INTRAMUSCULAR | Status: DC | PRN
  Administered 2019-02-05 – 2019-02-08 (×13): 100 ug via INTRAVENOUS
  Filled 2019-02-05 (×13): qty 2

## 2019-02-05 MED ORDER — PANTOPRAZOLE SODIUM 40 MG PO PACK
40.0000 mg | PACK | Freq: Every day | ORAL | Status: DC
  Administered 2019-02-05 – 2019-02-14 (×10): 40 mg
  Filled 2019-02-05 (×11): qty 20

## 2019-02-05 MED ORDER — VITAL HIGH PROTEIN PO LIQD
1000.0000 mL | ORAL | Status: DC
  Administered 2019-02-05: 1000 mL

## 2019-02-05 NOTE — Progress Notes (Signed)
Initial Nutrition Assessment  DOCUMENTATION CODES:   Obesity unspecified  INTERVENTION:   MD initiated trickle TF  Vital High Protein @ 20 ml/hr via OG tube  Recommend increase to 60 ml/hr  Provides: 1440 kcal, 126 grams protein, and 1203 ml free water.    NUTRITION DIAGNOSIS:   Increased nutrient needs related to acute illness(liver failure, SDH) as evidenced by estimated needs.  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    6/6 s/p L craniotomy for SDH GI following for liver failure. CT shows severe fatty infiltration of the liver  Patient is currently intubated on ventilator support Temp (24hrs), Avg:99.2 F (37.3 C), Min:98.4 F (36.9 C), Max:100.9 F (38.3 C)  Medications reviewed and include: lactulose NS with 20 mEq KCl/L @ 75 ml/hr  Labs reviewed: Na 126 (L), Ammonia 53 (H), TG: 373 (H) Drains: 70 ml  UOP: 360 ml I/O: +3.8 L - mild edema   NUTRITION - FOCUSED PHYSICAL EXAM:  Deferred   Diet Order:   Diet Order            Diet NPO time specified  Diet effective now              EDUCATION NEEDS:   No education needs have been identified at this time  Skin:  Skin Assessment: (MASD: abdomen, groin, breast, perineum, sacrum)  Last BM:  900 ml (on lactulose)  Height:   Ht Readings from Last 1 Encounters:  03-Mar-2019 5\' 6"  (1.676 m)    Weight:   Wt Readings from Last 1 Encounters:  02/04/19 103.1 kg    Ideal Body Weight:  59 kg  BMI:  Body mass index is 36.69 kg/m.  Estimated Nutritional Needs:   Kcal:   1150-1500  Protein:  >118 grams   Fluid:  > 1.5 L/day  Maylon Peppers RD, LDN, CNSC [phone] Pager [phone] After Hours Pager

## 2019-02-05 NOTE — Progress Notes (Signed)
NAME:  Taylor Robinson, MRN:  [id number], DOB:  [birth year], LOS: 2 ADMISSION DATE:  03/01/2019, CONSULTATION DATE:  03/01/19 REFERRING MD:  Dr. 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  Neurosurgery does not feel that emergent surgery is warranted at this time.  PCCM consulted for admit but with recommendations for early transfer to transplant center.     Past Medical History  Thyroid problems, GERD, DMT2  Significant Hospital Events    Consults:  NSGY  Procedures:   6/6  L craniotomy for SDH.    Significant Diagnostic Tests:  6/5 CTH >>Large subdural hematoma along the left frontoparietal convexity measuring 2.6 cm in width and causing a 6 mm rightward midline shift.  6/5 CT abd/pelvis 6/5  CT chest  CT head 6/7  reveals improvement in shift after evacuation, new focus of bleeding posterior temporal   Micro Data:  6/5 SARS coronavirus 2 >> neg 6/5 BCx 2 >> 6/5 UC >>  Antimicrobials:   Interim history/subjective:  Afebrile Hemodynamically stable Decreased urine output overnight given fluid bolus   Objective   Blood pressure (!) 131/59, pulse 73, temperature 98.7 F (37.1 C), temperature source Axillary, resp. rate 16, height 5\' 6"  (1.676 m), weight 103.1 kg, SpO2 99 %.    Vent Mode: CPAP;PSV FiO2 (%):  [30 %] 30 % Set Rate:  [16 bmp] 16 bmp Vt Set:  [470 mL] 470 mL PEEP:  [5 cmH20] 5 cmH20 Pressure Support:  [5 cmH20] 5 cmH20 Plateau Pressure:  [12 NGE95-28 cmH20] 12 cmH20   Intake/Output Summary (Last 24 hours) at 02/05/2019 0803 Last data filed at 02/05/2019 0600 Gross per 24 hour   Intake 3529.89 ml  Output 1330 ml  Net 2199.89 ml   Filed Weights   02/22/2019 0800 02/04/19 0530  Weight: 97.5 kg 103.1 kg    Examination: General: Obese chronically ill-appearing female intubated on mechanical ventilator support HEENT: Mucous membranes dry, icterus +, both pupils 2 to 3 mm sluggish reactive to light Neuro: Does not follow commands, moves left arm to deep pain stimulus and almost localizes CV: Regular rate and rhythm, no r/m/g PULM: Bilateral decreased breath sounds, CTA GI: Soft, nontender mildly distended, obese pannus Extremities: Bilateral lower extremity edema Skin: Skin breakdown within skin folds, severe jaundice scattered excoriations and bruising    Chest x-ray 6/8 personally reviewed shows ET tube in good position, no new infiltrates  Resolved Hospital Problem list     Elevated troponin  Lactic acidosis Anion gap metabolic acidosis - Likely type B lactic acidosis from inability of lactate clearance due to liver failure   Assessment & Plan:    Acute on chronic subdural hematoma with midline shift s/p evacuation - New left temporal hemorrhage, decreased mass-effect -No intervention at this time -Dexamethasone being tapered  Acute metabolic encephalopathy Elevated ammonia, hyperammonemia Hepatic encephalopathy - ct lactulose  Acute hypoxemic respiratory failure requiring intubation and mechanical ventilation  - remains intubated due to mental status and inability to protect airway.  PLAN:  -Spontaneous breathing trials but defer extubation until mental status improves  Acute liver failure -  Unclear etiology ? Drug toxicity ,  Direct bilirubin 19, total bilirubin 31 on admission - new medications within the last week, sitagliptin (0.5% risk liver injury) and ibuprofen 800mg  (rare as well, but usually occurs within 1-3 weeks of initiation)  -CT imaging with severe fatty infiltration of the liver - Hepatitis A, B and C negative  -Tylenol  level negative -Bilirubin stable currently, consider steroids empiric if arises, GI consulted-not a candidate for transplant according to them   Acute renal failure Hyponatremia, hypervolemic on exam - Multifactorial, possibly hepatorenal -Check FEna, if low consider volume expansion with albumin    Diabetes type 2 -CBGs with SSI  Goals of care: patient's daughter Trinna Postlex, telephone #[phone]- [phone].  She also has a younger brother who is 56 years old.  They are currently making medical decisions for the patient.  Upon further discussion with her she does state that her mother is married.  They are separated but not legally divorced.  Her husband is at [employer] who currently [place]. His telephone number is [phone].  I have attempted to call the number provided by his daughter to reach [employer] and there was no answer.  The patient is critically ill with multiple organ systems failure and requires high complexity decision making for assessment and support, frequent evaluation and titration of therapies, application of advanced monitoring technologies and extensive interpretation of multiple databases. Critical Care Time devoted to patient care services described in this note independent of APP/resident  time is 35 minutes.    Comer Locketakesh V. Vassie LollAlva, MD  [facility] Pager [phone] If no response call [phone]    02/05/2019 8:03 AM

## 2019-02-05 NOTE — Progress Notes (Signed)
Patient ID: Taylor Robinson, female   DOB: 12/27/1960, 58 y.o.   MRN: [id number] Subjective: Patient remains intubated  Objective: Vital signs in last 24 hours: Temp:  [98.4 F (36.9 C)-100.9 F (38.3 C)] 98.7 F (37.1 C) (06/08 0356) Pulse Rate:  [65-89] 73 (06/08 0630) Resp:  [11-23] 16 (06/08 0630) BP: (100-193)/(45-76) 131/59 (06/08 0630) SpO2:  [97 %-100 %] 99 % (06/08 0844) Arterial Line BP: (116-204)/(41-89) 158/60 (06/08 0630) FiO2 (%):  [30 %] 30 % (06/08 0753)  Intake/Output from previous day: 06/07 0701 - 06/08 0700 In: 3615.7 [I.V.:1741.7; NG/GT:80; IV Piggyback:1609.1] Out: 1330 [Urine:360; Drains:70; Stool:900] Intake/Output this shift: No intake/output data recorded.  more pruposeful on L, winces to stimuli, R hemiparetic but flexes to pain, PERRL  Lab Results: Lab Results  Component Value Date   WBC 13.2 (H) 02/05/2019   HGB 7.4 (L) 02/05/2019   HCT 21.3 (L) 02/05/2019   MCV 94.7 02/05/2019   PLT 153 02/05/2019   Lab Results  Component Value Date   INR 1.2 02/05/2019   BMET Lab Results  Component Value Date   NA 126 (L) 02/05/2019   K 3.6 02/05/2019   CL 94 (L) 02/05/2019   CO2 19 (L) 02/05/2019   GLUCOSE 142 (H) 02/05/2019   BUN 28 (H) 02/05/2019   CREATININE 1.57 (H) 02/05/2019   CALCIUM 7.4 (L) 02/05/2019    Studies/Results: Ct Head Wo Contrast  Result Date: 02/04/2019 CLINICAL DATA:  Intracranial hemorrhage follow up EXAM: CT HEAD WITHOUT CONTRAST TECHNIQUE: Contiguous axial images were obtained from the base of the skull through the vertex without intravenous contrast. COMPARISON:  02/23/2019 FINDINGS: Brain: There is new, acute intraparenchymal hemorrhage within the left temporal lobe, measuring 3.4 x 2.8 x 2.5 cm (volume = 12 cm^3). There is small amount of overlying subarachnoid blood. There is moderate edema within the left temporal lobe. Patient is status post evacuation of a left-sided subdural hematoma. There is a subdural drainage catheter  along the left convexity. Midline shift has substantially resolved. No hydrocephalus. Vascular: No abnormal hyperdensity of the major intracranial arteries or dural venous sinuses. No intracranial atherosclerosis. Skull: Status post left parietal craniotomy. Sinuses/Orbits: No fluid levels or advanced mucosal thickening of the visualized paranasal sinuses. No mastoid or middle ear effusion. The orbits are normal. IMPRESSION: 1. Acute intraparenchymal hematoma within the left temporal lobe with volume of 12 mL and moderate surrounding edema. Small amount of overlying subarachnoid blood. 2. Status post evacuation of left hemispheric subdural hematoma with resolution of midline shift. Critical Value/emergent results were called by telephone at the time of interpretation on 02/04/2019 at 5:52 am to Taylor MenghiniKimberly Meyren, NP , who verbally acknowledged these results. Electronically Signed   By: Taylor RobinsonKevin  Herman M.D.   On: 02/04/2019 05:52   Dg Chest Port 1 View  Result Date: 02/04/2019 CLINICAL DATA:  Mechanical ventilation. EXAM: PORTABLE CHEST 1 VIEW COMPARISON:  Jan 10, 2019 FINDINGS: Endotracheal tube is roughly 1.6 cm above the carina. Nasogastric tube extends into the abdomen but the tip is beyond the image. Again noted are a few densities at the lung bases and most compatible with atelectasis. Upper lungs are clear. Negative for a pneumothorax. Heart size is upper limits of normal and stable. IMPRESSION: 1. Basilar atelectasis with minimal change. 2. Support apparatuses as described. Electronically Signed   By: Taylor OverlieAdam  Henn M.D.   On: 02/04/2019 09:38   [place] Abdomen Limited Ruq  Result Date: 02/23/2019 CLINICAL DATA:  58 year old with acute liver failure.  Jaundice. EXAM: ULTRASOUND  ABDOMEN LIMITED RIGHT UPPER QUADRANT COMPARISON:  CT February 04, 2019 FINDINGS: Gallbladder: Echogenic sludge in the gallbladder. Concern for asymmetric gallbladder wall thickening but this apparent wall thickening could also be related to focal fat  sparing in the adjacent liver. Reportedly, sonographic Murphy sign cannot be evaluated due to patient's mental status. Common bile duct: Diameter: 0.3 cm Liver: No focal lesion. Diffusely increased echogenicity is suggestive for hepatic steatosis. Limited evaluation of the internal architecture due to the steatosis. No intrahepatic biliary dilatation. Portal vein is patent on color Doppler imaging with normal direction of blood flow towards the liver. IMPRESSION: 1. Hepatic steatosis.  No focal liver lesion. 2. Gallbladder sludge and questionable asymmetric gallbladder wall thickening. No biliary dilatation. Electronically Signed   By: Taylor Daft M.D.   On: 02/25/2019 18:45    Assessment/Plan: Seems better, drains removed, ccm  Estimated body mass index is 36.69 kg/m as calculated from the following:   Height as of this encounter: 5\' 6"  (1.676 m).   Weight as of this encounter: 103.1 kg.    LOS: 2 days    Taylor Robinson 02/05/2019, 10:01 AM    '

## 2019-02-06 ENCOUNTER — Inpatient Hospital Stay (HOSPITAL_COMMUNITY): Payer: BC Managed Care – PPO

## 2019-02-06 LAB — COMPREHENSIVE METABOLIC PANEL
ALT: 26 U/L (ref 0–44)
AST: 153 U/L — ABNORMAL HIGH (ref 15–41)
Albumin: 2.1 g/dL — ABNORMAL LOW (ref 3.5–5.0)
Alkaline Phosphatase: 206 U/L — ABNORMAL HIGH (ref 38–126)
Anion gap: 13 (ref 5–15)
BUN: 40 mg/dL — ABNORMAL HIGH (ref 6–20)
CO2: 19 mmol/L — ABNORMAL LOW (ref 22–32)
Calcium: 7.5 mg/dL — ABNORMAL LOW (ref 8.9–10.3)
Chloride: 98 mmol/L (ref 98–111)
Creatinine, Ser: 1.87 mg/dL — ABNORMAL HIGH (ref 0.44–1.00)
GFR calc Af Amer: 34 mL/min — ABNORMAL LOW (ref >60)
GFR calc non Af Amer: 30 mL/min — ABNORMAL LOW (ref >60)
Glucose, Bld: 158 mg/dL — ABNORMAL HIGH (ref 70–99)
Potassium: 3.8 mmol/L (ref 3.5–5.1)
Sodium: 130 mmol/L — ABNORMAL LOW (ref 135–145)
Total Bilirubin: 29.2 mg/dL (ref 0.3–1.2)
Total Protein: 5.1 g/dL — ABNORMAL LOW (ref 6.5–8.1)

## 2019-02-06 LAB — GLUCOSE, CAPILLARY
Glucose-Capillary: 149 mg/dL — ABNORMAL HIGH (ref 70–99)
Glucose-Capillary: 163 mg/dL — ABNORMAL HIGH (ref 70–99)
Glucose-Capillary: 178 mg/dL — ABNORMAL HIGH (ref 70–99)
Glucose-Capillary: 164 mg/dL — ABNORMAL HIGH (ref 70–99)
Glucose-Capillary: 159 mg/dL — ABNORMAL HIGH (ref 70–99)

## 2019-02-06 LAB — CBC WITH DIFFERENTIAL/PLATELET
Abs Immature Granulocytes: 1.84 10*3/uL — ABNORMAL HIGH (ref 0.00–0.07)
Basophils Absolute: 0 10*3/uL (ref 0.0–0.1)
Basophils Relative: 0 %
Eosinophils Absolute: 0 10*3/uL (ref 0.0–0.5)
Eosinophils Relative: 0 %
HCT: 20.1 % — ABNORMAL LOW (ref 36.0–46.0)
Hemoglobin: 6.8 g/dL — CL (ref 12.0–15.0)
Immature Granulocytes: 13 %
Lymphocytes Relative: 8 %
Lymphs Abs: 1.1 10*3/uL (ref 0.7–4.0)
MCH: 32.9 pg (ref 26.0–34.0)
MCHC: 33.8 g/dL (ref 30.0–36.0)
MCV: 97.1 fL (ref 80.0–100.0)
Monocytes Absolute: 1 10*3/uL (ref 0.1–1.0)
Monocytes Relative: 7 %
Neutro Abs: 10.2 10*3/uL — ABNORMAL HIGH (ref 1.7–7.7)
Neutrophils Relative %: 72 %
Platelets: 150 10*3/uL (ref 150–400)
RBC: 2.07 MIL/uL — ABNORMAL LOW (ref 3.87–5.11)
RDW: 14.7 % (ref 11.5–15.5)
WBC: 14.2 10*3/uL — ABNORMAL HIGH (ref 4.0–10.5)
nRBC: 1.4 % — ABNORMAL HIGH (ref 0.0–0.2)

## 2019-02-06 LAB — PROTIME-INR
INR: 1.3 — ABNORMAL HIGH (ref 0.8–1.2)
Prothrombin Time: 15.7 seconds — ABNORMAL HIGH (ref 11.4–15.2)

## 2019-02-06 LAB — PREPARE RBC (CROSSMATCH)

## 2019-02-06 LAB — MAGNESIUM: Magnesium: 2.3 mg/dL (ref 1.7–2.4)

## 2019-02-06 LAB — PHOSPHORUS: Phosphorus: 2.4 mg/dL — ABNORMAL LOW (ref 2.5–4.6)

## 2019-02-06 LAB — HEMOGLOBIN AND HEMATOCRIT, BLOOD
HCT: 24.9 % — ABNORMAL LOW (ref 36.0–46.0)
Hemoglobin: 8.5 g/dL — ABNORMAL LOW (ref 12.0–15.0)

## 2019-02-06 MED ORDER — POTASSIUM PHOSPHATES 15 MMOLE/5ML IV SOLN
30.0000 mmol | Freq: Once | INTRAVENOUS | Status: AC
  Administered 2019-02-06: 10:00:00 30 mmol via INTRAVENOUS
  Filled 2019-02-06: qty 10

## 2019-02-06 MED ORDER — FUROSEMIDE 10 MG/ML IJ SOLN
60.0000 mg | Freq: Once | INTRAMUSCULAR | Status: AC
  Administered 2019-02-06: 60 mg via INTRAVENOUS
  Filled 2019-02-06: qty 6

## 2019-02-06 MED ORDER — GERHARDT'S BUTT CREAM
TOPICAL_CREAM | Freq: Three times a day (TID) | CUTANEOUS | Status: AC
  Administered 2019-02-06: 17:00:00 via TOPICAL
  Administered 2019-02-06: 1 via TOPICAL
  Administered 2019-02-06 – 2019-02-09 (×9): via TOPICAL
  Administered 2019-02-09: 1 via TOPICAL
  Administered 2019-02-10: 11:00:00 via TOPICAL
  Administered 2019-02-10: 1 via TOPICAL
  Administered 2019-02-10 – 2019-02-13 (×6): via TOPICAL
  Filled 2019-02-06: qty 1

## 2019-02-06 MED ORDER — SODIUM CHLORIDE 0.9% IV SOLUTION: Freq: Once | INTRAVENOUS | Status: DC

## 2019-02-06 NOTE — Progress Notes (Addendum)
NAME:  Taylor Robinson, MRN:  [id number], DOB:  05/18/1961, LOS: 3 ADMISSION DATE:  02-11-2019, CONSULTATION DATE:  11-Feb-2019 REFERRING MD:  Dr. 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  Neurosurgery does not feel that emergent surgery is warranted at this time.  PCCM consulted for admit but with recommendations for early transfer to transplant center.    Past Medical History  Thyroid problems, GERD, DMT2  Significant Hospital Events    Consults:  NSGY  Procedures:  6/6 ETT 6/6  L craniotomy for SDH.   Significant Diagnostic Tests:  6/5 CTH >>Large subdural hematoma along the left frontoparietal convexity measuring 2.6 cm in width and causing a 6 mm rightward midline shift. 6/5 CT abd/pelvis + 6/5  CT chest: No acute intrathoracic pathology. Underdistention of the ascending colon versus less likely mild Colitis. Severe fatty infiltration of the liver. CT head 6/7  reveals improvement in shift after evacuation, new focus of bleeding posterior temporal.  Micro Data:  6/5 SARS coronavirus 2 >> neg 6/5 BCx 2 >> 6/5 UC >> insignificant growth  Antimicrobials:   Interim history/subjective:  Afebrile. LFT and Bili still climbing.  Weaning well from a pulmonary standpoint  Remains encephalopathic  Objective   Blood pressure 122/64, pulse 77, temperature 99.1 F (37.3 C), temperature source Axillary, resp. rate 19, height 5\' 6"  (1.676 m), weight 103.9 kg, SpO2 100 %.    Vent Mode: PRVC FiO2 (%):  [30 %] 30 % Set Rate:  [16 bmp] 16 bmp Vt Set:  [470 mL] 470 mL PEEP:  [5 cmH20] 5 cmH20 Pressure Support:   [5 cmH20-15 cmH20] 15 cmH20 Plateau Pressure:  [14 cmH20-18 cmH20] 16 cmH20   Intake/Output Summary (Last 24 hours) at 02/06/2019 0739 Last data filed at 02/06/2019 0600 Gross per 24 hour  Intake 2615.39 ml  Output 1180 ml  Net 1435.39 ml   Filed Weights   01/30/2019 0800 02/04/19 0530 02/06/19 0500  Weight: 97.5 kg 103.1 kg 103.9 kg    Examination: General:  Morbidly obese jaundiced female on vent Neuro:  Opens eyes to voice. Does not follow commands.  HEENT:  /AT, No appreciable JVD , scleral icterus.  Cardiovascular:  RRR, no MRG Lungs:  clear Abdomen:  Soft, non-distended Musculoskeletal:  No acute deformity. 2+ nonpitting lower extremity edema.  Skin:  Intact, MMM, jaundiced.    Resolved Hospital Problem list     Elevated troponin  Lactic acidosis Anion gap metabolic acidosis - Likely type B lactic acidosis from inability of lactate clearance due to liver failure   Assessment & Plan:    Acute on chronic subdural hematoma with midline shift s/p evacuation: Now complicated by L temporal IPH. Neurosurgery feels she will likely be aphasic, potentially permanently based on her injuries. Which, will make determining her level of encephalopathy very difficult.   - Management per neurosurgery.  - Dexamethasone being tapered - Keppra for sz ppx  Acute metabolic encephalopathy: hyperammonemia, SDH, IPH Hepatic encephalopathy - continue lactulose  Acute hypoxemic respiratory failure requiring intubation and mechanical ventilation: remains intubated due to mental status and inability to protect airway. Weaning well from pulmonary standpoint.  -  Daily WUA and SBT - Unclear if she will ever recover to the point of extubation.   Acute liver failure: Unclear etiology. ? Drug toxicity , Direct bilirubin 19, total bilirubin 31 on admission. Continuing to climb. CT imaging with severe fatty infiltration of the liver. Hepatitis A, B and C negative. New medications within the week  prior to arrival, sitagliptin (0.5% risk liver injury) and ibuprofen 800mg  (rare as well, but usually occurs within 1-3 weeks of initiation). Tylenol level negative. - GI consulted-not a candidate for transplant in her current state.  - Follow LFT, Bilirubin - Consider empiric steroids if bilirubin continues to rise.  - [facility] following from distance. Call if needed.   Acute renal failure: Hyponatremia, hypervolemic on exam. Multifactorial, possibly hepatorenal. Worsening.  - Will give trial of diuretics today. Urine very concentrated and volume overloaded. Has received albumin.  - Trend BMP  Hypokalemia Hypomagnesemia - replace K and Mg  Anemia of critical illness.  - Transfuse one unit PRBC for Hgb 6.8. No evidence of acute bleed.   Diabetes type 2 -CBGs with SSI  Goals of care: Husband and Daughter updated   Joneen Roachaul Hoffman, AGACNP-BC [facility] Pager [phone] or [phone]  02/06/2019 8:18 AM

## 2019-02-06 NOTE — Consult Note (Signed)
[facility] Nurse wound consult note Reason for Consult: Reconsulted to assess intragluteal cleft for moisture lesion vs pressure injury differential assessment.  Moisture lesions similar to those noted on Sunday in the inframammary and inguinal areas. Wound type:koisture associated skin damage in the intragluteal areas, IAD plus ITD Pressure Injury POA: NA Measurement: total area measures 8cm x 16cm with purpura and partial thickness skin loss presenting as mirror images consistent with moisture associated skin damage. Wound QQV:ZDGL, moist Drainage (amount, consistency, odor) scant serous Periwound: intact, jaundiced Dressing procedure/placement/frequency: We will discontinue the silicone foam in this area in favor of a topical moisture barrier cream consisting of compounded mixture 1:1:1 hydrocortisone/lotrimin/zinc oxide three times daily. She is on a mattress replacement with low air loss feature and is being turned and repositioned.  [facility] nursing team will not follow, but will remain available to this patient, the nursing and medical teams.  Please re-consult if needed. Thanks, Maudie Flakes, MSN, RN, [place], Arther Abbott  Pager# [phone]

## 2019-02-06 NOTE — Progress Notes (Signed)
Patient ID: Taylor Robinson, female   DOB: 10-May-1961, 58 y.o.   MRN: [id number] Subjective: Patient remains intubated  Objective: Vital signs in last 24 hours: Temp:  [97.9 F (36.6 C)-99.1 F (37.3 C)] 99.1 F (37.3 C) (06/09 0600) Pulse Rate:  [70-99] 77 (06/09 0734) Resp:  [12-26] 19 (06/09 0734) BP: (116-140)/(54-88) 122/64 (06/09 0734) SpO2:  [97 %-100 %] 98 % (06/09 0735) Arterial Line BP: (132-178)/(49-71) 150/58 (06/09 0600) FiO2 (%):  [30 %] 30 % (06/09 0735) Weight:  [103.9 kg] 103.9 kg (06/09 0500)  Intake/Output from previous day: 06/08 0701 - 06/09 0700 In: 2615.4 [I.V.:1775.4; NG/GT:308; IV Piggyback:532] Out: 1180 [Urine:380; Emesis/NG output:50; Stool:750] Intake/Output this shift: No intake/output data recorded.  Purposeful on left, right hemiparetic, pupils equal reactive.  No change.  Lab Results: Lab Results  Component Value Date   WBC 14.2 (H) 02/06/2019   HGB 6.8 (LL) 02/06/2019   HCT 20.1 (L) 02/06/2019   MCV 97.1 02/06/2019   PLT 150 02/06/2019   Lab Results  Component Value Date   INR 1.3 (H) 02/06/2019   BMET Lab Results  Component Value Date   NA 130 (L) 02/06/2019   K 3.8 02/06/2019   CL 98 02/06/2019   CO2 19 (L) 02/06/2019   GLUCOSE 158 (H) 02/06/2019   BUN 40 (H) 02/06/2019   CREATININE 1.87 (H) 02/06/2019   CALCIUM 7.5 (L) 02/06/2019    Studies/Results: No results found.  Assessment/Plan: Stable neurologically.  No new recommendations  Estimated body mass index is 36.97 kg/m as calculated from the following:   Height as of this encounter: 5\' 6"  (1.676 m).   Weight as of this encounter: 103.9 kg.    LOS: 3 days    Eustace Moore 02/06/2019, 8:00 AM

## 2019-02-07 LAB — CBC WITH DIFFERENTIAL/PLATELET
Abs Immature Granulocytes: 3.42 10*3/uL — ABNORMAL HIGH (ref 0.00–0.07)
Basophils Absolute: 0.1 10*3/uL (ref 0.0–0.1)
Basophils Relative: 0 %
Eosinophils Absolute: 0.1 10*3/uL (ref 0.0–0.5)
Eosinophils Relative: 1 %
HCT: 25.1 % — ABNORMAL LOW (ref 36.0–46.0)
Hemoglobin: 8.8 g/dL — ABNORMAL LOW (ref 12.0–15.0)
Immature Granulocytes: 16 %
Lymphocytes Relative: 6 %
Lymphs Abs: 1.3 10*3/uL (ref 0.7–4.0)
MCH: 33.6 pg (ref 26.0–34.0)
MCHC: 35.1 g/dL (ref 30.0–36.0)
MCV: 95.8 fL (ref 80.0–100.0)
Monocytes Absolute: 1.5 10*3/uL — ABNORMAL HIGH (ref 0.1–1.0)
Monocytes Relative: 7 %
Neutro Abs: 15 10*3/uL — ABNORMAL HIGH (ref 1.7–7.7)
Neutrophils Relative %: 70 %
Platelets: 153 10*3/uL (ref 150–400)
RBC: 2.62 MIL/uL — ABNORMAL LOW (ref 3.87–5.11)
RDW: 15.2 % (ref 11.5–15.5)
WBC: 21.4 10*3/uL — ABNORMAL HIGH (ref 4.0–10.5)
nRBC: 2.2 % — ABNORMAL HIGH (ref 0.0–0.2)

## 2019-02-07 LAB — GLUCOSE, CAPILLARY
Glucose-Capillary: 149 mg/dL — ABNORMAL HIGH (ref 70–99)
Glucose-Capillary: 151 mg/dL — ABNORMAL HIGH (ref 70–99)
Glucose-Capillary: 165 mg/dL — ABNORMAL HIGH (ref 70–99)
Glucose-Capillary: 166 mg/dL — ABNORMAL HIGH (ref 70–99)
Glucose-Capillary: 171 mg/dL — ABNORMAL HIGH (ref 70–99)
Glucose-Capillary: 177 mg/dL — ABNORMAL HIGH (ref 70–99)
Glucose-Capillary: 178 mg/dL — ABNORMAL HIGH (ref 70–99)

## 2019-02-07 LAB — BPAM RBC
Blood Product Expiration Date: 202007102359
ISSUE DATE / TIME: 202006091248
Unit Type and Rh: 5100

## 2019-02-07 LAB — COMPREHENSIVE METABOLIC PANEL
ALT: 31 U/L (ref 0–44)
AST: 190 U/L — ABNORMAL HIGH (ref 15–41)
Albumin: 2.2 g/dL — ABNORMAL LOW (ref 3.5–5.0)
Alkaline Phosphatase: 239 U/L — ABNORMAL HIGH (ref 38–126)
Anion gap: 13 (ref 5–15)
BUN: 50 mg/dL — ABNORMAL HIGH (ref 6–20)
CO2: 17 mmol/L — ABNORMAL LOW (ref 22–32)
Calcium: 7.5 mg/dL — ABNORMAL LOW (ref 8.9–10.3)
Chloride: 101 mmol/L (ref 98–111)
Creatinine, Ser: 2.14 mg/dL — ABNORMAL HIGH (ref 0.44–1.00)
GFR calc Af Amer: 29 mL/min — ABNORMAL LOW (ref >60)
GFR calc non Af Amer: 25 mL/min — ABNORMAL LOW (ref >60)
Glucose, Bld: 165 mg/dL — ABNORMAL HIGH (ref 70–99)
Potassium: 4.1 mmol/L (ref 3.5–5.1)
Sodium: 131 mmol/L — ABNORMAL LOW (ref 135–145)
Total Bilirubin: 25.5 mg/dL (ref 0.3–1.2)
Total Protein: 5.5 g/dL — ABNORMAL LOW (ref 6.5–8.1)

## 2019-02-07 LAB — CULTURE, BLOOD (ROUTINE X 2)
Culture: NO GROWTH
Culture: NO GROWTH
Special Requests: ADEQUATE
Special Requests: ADEQUATE

## 2019-02-07 LAB — MAGNESIUM: Magnesium: 2.3 mg/dL (ref 1.7–2.4)

## 2019-02-07 LAB — HSV DNA BY PCR (REFERENCE LAB): HSV 1 DNA: NEGATIVE

## 2019-02-07 LAB — PHOSPHORUS: Phosphorus: 3.7 mg/dL (ref 2.5–4.6)

## 2019-02-07 LAB — TYPE AND SCREEN
ABO/RH(D): O POS
Antibody Screen: NEGATIVE
Unit division: 0

## 2019-02-07 LAB — PROTIME-INR
INR: 1.2 (ref 0.8–1.2)
Prothrombin Time: 15.4 seconds — ABNORMAL HIGH (ref 11.4–15.2)

## 2019-02-07 LAB — HERPES SIMPLEX VIRUS(HSV) DNA BY PCR: HSV 2 DNA: NEGATIVE

## 2019-02-07 MED ORDER — FUROSEMIDE 10 MG/ML IJ SOLN
40.0000 mg | Freq: Once | INTRAMUSCULAR | Status: AC
  Administered 2019-02-07: 40 mg via INTRAVENOUS
  Filled 2019-02-07: qty 4

## 2019-02-07 MED ORDER — VITAL HIGH PROTEIN PO LIQD
1000.0000 mL | ORAL | Status: DC
  Administered 2019-02-07: 1000 mL

## 2019-02-07 MED ORDER — SODIUM CHLORIDE 0.9 % IV SOLN: INTRAVENOUS | Status: DC

## 2019-02-07 NOTE — Progress Notes (Signed)
Chart reviewed.  Labs reviewed:  Bilirubin remains high; autoimmune serologies and HSV and HIV and acute hepatitis panel negative.  INR not increasing.    I have reviewed case with Dr. Merrilee Jansky at [facility] (hepatology); he agrees that patient does not currently meet criteria for liver transplantation, recent craniotomy notwithstanding, since her mental status is improving and her INR hasn't gone up.  He does not recommend steroids or mucomyst at the present time.  It is felt this is most likely a drug reaction, superimposed likely on chronic hepatic steatosis, but certainly hemolysis from intracranial bleeding could lead to an enhanced degree of hyperbilirubinemia.  Suggest:  1.  Continued supportive care. 2.  No steroids or mucomyst at the present time. 3.  Given negative autoimmune serologies and lack of progressive liver dysfunction and recent craniotomy and patient's morbid obesity, risks of liver biopsy felt to significantly outweigh the benefits. 4.  Check LDH and CMV PCR. 5.  Follow LFTs and PT/INR and renal function daily. 6.  [facility] will follow at a distance, please call if any questions.  Thank you.

## 2019-02-07 NOTE — Progress Notes (Signed)
Patient ID: Taylor Robinson, female   DOB: Jun 17, 1961, 58 y.o.   MRN: [id number] Opens eyes spont, R hemiparetic, PERRL, follows commands, nods yes to "are you doing ok?" incision CDI, seems to be improving neurologically. Wean to extubate

## 2019-02-07 NOTE — Progress Notes (Signed)
NAME:  Taylor Robinson, MRN:  [id number], DOB:  04/11/1961, LOS: 4 ADMISSION DATE:  2019/02/25, CONSULTATION DATE:  02/25/2019 REFERRING MD:  Dr. 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  Neurosurgery does not feel that emergent surgery is warranted at this time.  PCCM consulted for admit but with recommendations for early transfer to transplant center.   Past Medical History  Thyroid problems, GERD, DMT2  Significant Hospital Events    Consults:  NSGY  Procedures:  6/6 ETT 6/6  L craniotomy for SDH.   Significant Diagnostic Tests:  6/5 CTH >>Large subdural hematoma along the left frontoparietal convexity measuring 2.6 cm in width and causing a 6 mm rightward midline shift. 6/5 CT abd/pelvis + 6/5  CT chest: No acute intrathoracic pathology. Underdistention of the ascending colon versus less likely mild Colitis. Severe fatty infiltration of the liver. CT head 6/7  reveals improvement in shift after evacuation, new focus of bleeding posterior temporal.   Micro Data:  6/5 SARS coronavirus 2 >> neg 6/5 BCx 2 >> 6/5 UC >> insignificant growth  Antimicrobials:   Interim history/subjective:  LFTs climbing, Bili slightly down. Weaning well from a pulmonary standpoint at PSV 10/5.  +6.9L positive.  Objective   Blood pressure (!) 151/84, pulse 77, temperature 98.6 F (37 C), temperature source Axillary, resp. rate 16, height 5\' 6"  (1.676 m), weight 108.1 kg, SpO2 100 %.    Vent Mode: PSV;CPAP FiO2 (%):  [30 %] 30 % Set Rate:  [16 bmp] 16 bmp Vt Set:  [470 mL] 470 mL PEEP:  [5 cmH20] 5 cmH20 Pressure  Support:  [10 cmH20] 10 cmH20 Plateau Pressure:  [15 cmH20-22 cmH20] 15 cmH20   Intake/Output Summary (Last 24 hours) at 02/07/2019 0935 Last data filed at 02/07/2019 0900 Gross per 24 hour  Intake 2887.73 ml  Output 1360 ml  Net 1527.73 ml   Filed Weights   02/04/19 0530 02/06/19 0500 02/07/19 0500  Weight: 103.1 kg 103.9 kg 108.1 kg    Examination: General:  Morbidly obese jaundiced female on vent Neuro:  Does not follow commands though moving both arms HEENT:  [place]/AT, No appreciable JVD, scleral icterus.  Cardiovascular:  RRR, no MRG Lungs:  clear Abdomen:  Soft, non-distended Musculoskeletal:  No acute deformity. 2+ nonpitting lower extremity edema Skin:  Intact, MMM, jaundiced.   Assessment & Plan:   Acute on chronic subdural hematoma with midline shift s/p evacuation: Now complicated by L temporal IPH. Neurosurgery feels she will likely be aphasic, potentially permanently based on her injuries. Which, will make determining her level of encephalopathy very difficult - Management per neurosurgery - Dexamethasone being tapered - Keppra for sz ppx  Acute metabolic encephalopathy: hyperammonemia / hepatic, SDH, IPH - Continue lactulose and supportive care  Acute hypoxemic respiratory failure requiring intubation and mechanical ventilation: remains intubated due to mental status and inability to protect airway. Weaning well from pulmonary standpoint; however, mental status remains barrier to extubation. - Continue vent support with daily weans / WUA - No extubation until mental status improves; though, unclear if she will ever recover to the point of extubation.   Acute  liver failure: Unclear etiology. ? Drug toxicity , Direct bilirubin 19, total bilirubin 31 on admission. Continuing to climb. CT imaging with severe fatty infiltration of the liver. Hepatitis A, B and C negative. New medications within the week prior to arrival, sitagliptin (0.5% risk liver injury) and ibuprofen  800mg  (rare as well, but usually occurs within 1-3 weeks of initiation). Tylenol level negative. - GI consulted-not a candidate for transplant in her current state.  - Follow LFT, Bilirubin - Consider empiric steroids if bilirubin continues to rise - [facility] following from distance. Call if needed  Acute renal failure: Hyponatremia, hypervolemic on exam. Multifactorial, possibly hepatorenal. - D/c fluids - 40mg  lasix x 1 - Trend BMP  Anemia of critical illness.  - Transfuse to maintain Hgb > 7  Diabetes type 2 - CBGs with SSI   CC time: 35 min.   Rutherford Guysahul Jadriel Saxer, PA Sidonie Dickens- C [place] [facility] Pager: [phone].  If no answer, [phone] - [phone] 02/07/2019, 9:48 AM

## 2019-02-07 NOTE — Progress Notes (Signed)
Nutrition Follow-up  DOCUMENTATION CODES:   Obesity unspecified  INTERVENTION:   Increase Vital High Protein to 60 ml/hr via OG tube  Provides: 1440 kcal, 126 grams protein, and 1203 ml free water.    NUTRITION DIAGNOSIS:   Increased nutrient needs related to acute illness(liver failure, SDH) as evidenced by estimated needs.  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    6/6 s/p L craniotomy for SDH GI following for liver failure. CT shows severe fatty infiltration of the liver IVF's stopped Per MD will remain intubated for now  Patient is currently intubated on ventilator support Temp (24hrs), Avg:98.9 F (37.2 C), Min:98.6 F (37 C), Max:99.3 F (37.4 C)  Medications reviewed and include: decadron, lactulose Labs reviewed: Na 131 (L), Bilirubin: 25.5 (H) UOP: 710 ml I/O: +6.9 L - mild edema   NUTRITION - FOCUSED PHYSICAL EXAM:  Deferred   Diet Order:   Diet Order            Diet NPO time specified  Diet effective now              EDUCATION NEEDS:   No education needs have been identified at this time  Skin:  Skin Assessment: (MASD: abdomen, groin, breast, perineum, sacrum)  Last BM:  650 via rectal tube (on lactulose)  Height:   Ht Readings from Last 1 Encounters:  02/01/2019 5\' 6"  (1.676 m)    Weight:   Wt Readings from Last 1 Encounters:  02/07/19 108.1 kg    Ideal Body Weight:  59 kg  BMI:  Body mass index is 38.47 kg/m.  Estimated Nutritional Needs:   Kcal:  1150-1500  Protein:  >118 grams   Fluid:  > 1.5  L/day  Maylon Peppers RD, LDN, CNSC [phone] Pager [phone] After Hours Pager

## 2019-02-08 LAB — GLUCOSE, CAPILLARY
Glucose-Capillary: 173 mg/dL — ABNORMAL HIGH (ref 70–99)
Glucose-Capillary: 163 mg/dL — ABNORMAL HIGH (ref 70–99)
Glucose-Capillary: 168 mg/dL — ABNORMAL HIGH (ref 70–99)
Glucose-Capillary: 145 mg/dL — ABNORMAL HIGH (ref 70–99)
Glucose-Capillary: 116 mg/dL — ABNORMAL HIGH (ref 70–99)
Glucose-Capillary: 101 mg/dL — ABNORMAL HIGH (ref 70–99)

## 2019-02-08 LAB — CULTURE, BLOOD (ROUTINE X 2)
Culture: NO GROWTH
Culture: NO GROWTH
Special Requests: ADEQUATE

## 2019-02-08 LAB — MAGNESIUM: Magnesium: 2.5 mg/dL — ABNORMAL HIGH (ref 1.7–2.4)

## 2019-02-08 LAB — CBC WITH DIFFERENTIAL/PLATELET
Abs Immature Granulocytes: 1.2 10*3/uL — ABNORMAL HIGH (ref 0.00–0.07)
Basophils Absolute: 0 10*3/uL (ref 0.0–0.1)
Basophils Relative: 0 %
Eosinophils Absolute: 0 10*3/uL (ref 0.0–0.5)
Eosinophils Relative: 0 %
HCT: 25.5 % — ABNORMAL LOW (ref 36.0–46.0)
Hemoglobin: 8.8 g/dL — ABNORMAL LOW (ref 12.0–15.0)
Lymphocytes Relative: 5 %
Lymphs Abs: 1.2 10*3/uL (ref 0.7–4.0)
MCH: 33.2 pg (ref 26.0–34.0)
MCHC: 34.5 g/dL (ref 30.0–36.0)
MCV: 96.2 fL (ref 80.0–100.0)
Monocytes Absolute: 2.2 10*3/uL — ABNORMAL HIGH (ref 0.1–1.0)
Monocytes Relative: 9 %
Myelocytes: 5 %
Neutro Abs: 19.6 10*3/uL — ABNORMAL HIGH (ref 1.7–7.7)
Neutrophils Relative %: 81 %
Platelets: 142 10*3/uL — ABNORMAL LOW (ref 150–400)
RBC: 2.65 MIL/uL — ABNORMAL LOW (ref 3.87–5.11)
RDW: 15.4 % (ref 11.5–15.5)
WBC: 24.2 10*3/uL — ABNORMAL HIGH (ref 4.0–10.5)
nRBC: 2.9 % — ABNORMAL HIGH (ref 0.0–0.2)
nRBC: 3 /100 WBC — ABNORMAL HIGH

## 2019-02-08 LAB — PHOSPHORUS: Phosphorus: 3.1 mg/dL (ref 2.5–4.6)

## 2019-02-08 LAB — LACTATE DEHYDROGENASE: LDH: 468 U/L — ABNORMAL HIGH (ref 98–192)

## 2019-02-08 MED ORDER — FUROSEMIDE 10 MG/ML IJ SOLN
40.0000 mg | Freq: Once | INTRAMUSCULAR | Status: AC
  Administered 2019-02-08: 40 mg via INTRAVENOUS
  Filled 2019-02-08: qty 4

## 2019-02-08 MED ORDER — FUROSEMIDE 10 MG/ML IJ SOLN: 60.0000 mg | Freq: Once | INTRAMUSCULAR | Status: DC

## 2019-02-08 MED ORDER — PSYLLIUM 95 % PO PACK
1.0000 | PACK | Freq: Two times a day (BID) | ORAL | Status: DC
  Administered 2019-02-09 – 2019-02-11 (×4): 1
  Filled 2019-02-08 (×8): qty 1

## 2019-02-08 MED ORDER — LEVETIRACETAM 100 MG/ML PO SOLN: 500.0000 mg | Freq: Two times a day (BID) | ORAL | Status: DC

## 2019-02-08 MED ORDER — LABETALOL HCL 100 MG PO TABS
200.0000 mg | ORAL_TABLET | Freq: Two times a day (BID) | ORAL | Status: DC
  Administered 2019-02-09 – 2019-02-10 (×2): 200 mg
  Filled 2019-02-08 (×2): qty 2

## 2019-02-08 MED ORDER — LEVETIRACETAM IN NACL 500 MG/100ML IV SOLN
500.0000 mg | Freq: Two times a day (BID) | INTRAVENOUS | Status: DC
  Administered 2019-02-08 – 2019-02-09 (×3): 500 mg via INTRAVENOUS
  Filled 2019-02-08 (×3): qty 100

## 2019-02-08 NOTE — Progress Notes (Signed)
NAME:  Taylor Robinson, MRN:  [id number], DOB:  02/20/1961, LOS: 5 ADMISSION DATE:  02/25/2019, CONSULTATION DATE:  02/13/2019 REFERRING MD:  Dr. 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  Neurosurgery does not feel that emergent surgery is warranted at this time.  PCCM consulted for admit but with recommendations for early transfer to transplant center.   Past Medical History  Thyroid problems, GERD, DMT2  Significant Hospital Events    Consults:  NSGY  Procedures:  6/6 ETT 6/6  L craniotomy for SDH.   Significant Diagnostic Tests:  6/5 CTH >>Large subdural hematoma along the left frontoparietal convexity measuring 2.6 cm in width and causing a 6 mm rightward midline shift. 6/5 CT abd/pelvis + 6/5  CT chest: No acute intrathoracic pathology. Underdistention of the ascending colon versus less likely mild Colitis. Severe fatty infiltration of the liver. CT head 6/7  reveals improvement in shift after evacuation, new focus of bleeding posterior temporal.   Micro Data:  6/5 SARS coronavirus 2 >> neg 6/5 BCx 2 >> 6/5 UC >> insignificant growth  Antimicrobials:   Interim history/subjective:  More awake this AM, opens eyes to voice but not following any commands.  Objective   Blood pressure (!) 176/90, pulse 86, temperature 98 F (36.7 C), temperature source Axillary, resp. rate 19, height 5\' 6"  (1.676 m), weight 106.5 kg, SpO2 100 %.    Vent Mode: PSV;CPAP FiO2 (%):  [30 %] 30 % Set Rate:  [16 bmp] 16 bmp Vt Set:  [470 mL] 470 mL PEEP:  [5 cmH20] 5 cmH20 Pressure Support:  [5 cmH20] 5 cmH20 Plateau  Pressure:  [13 cmH20-16 cmH20] 16 cmH20   Intake/Output Summary (Last 24 hours) at 02/08/2019 1050 Last data filed at 02/08/2019 1000 Gross per 24 hour  Intake 1887.63 ml  Output 1440 ml  Net 447.63 ml   Filed Weights   02/06/19 0500 02/07/19 0500 02/08/19 0500  Weight: 103.9 kg 108.1 kg 106.5 kg    Examination: General:  Morbidly obese jaundiced female on vent Neuro:  Opens eyes to voice but not follow commands HEENT:  [place]/AT, No appreciable JVD, scleral icterus Cardiovascular:  RRR, no MRG Lungs:  CTAB Abdomen:  Soft, non-distended Musculoskeletal:  No acute deformity. 2+ nonpitting lower extremity edema Skin:  Intact, MMM, significantly jaundiced  Assessment & Plan:   Acute on chronic subdural hematoma with midline shift s/p evacuation: Now complicated by L temporal IPH. Neurosurgery feels she will likely be aphasic, potentially permanently based on her injuries. Which, will make determining her level of encephalopathy very difficult - Management per neurosurgery - Dexamethasone being tapered - Keppra for sz ppx  Acute metabolic encephalopathy: hyperammonemia / hepatic, SDH, IPH - Continue lactulose and supportive care, hoping with time she will recover to the point of following commands, etc  Acute hypoxemic respiratory failure requiring intubation and mechanical ventilation: remains intubated due to mental status and inability to protect airway. Weaning well from pulmonary standpoint; however, mental status remains barrier to extubation. - Continue vent support with daily weans / WUA - No extubation until mental status improves; though, unclear if she will ever recover  to the point of extubation.   Acute liver failure: Unclear etiology, gradually improving. ? Drug toxicity , Direct bilirubin 19, total bilirubin 31 on admission. Continuing to climb. CT imaging with severe fatty infiltration of the liver. Hepatitis A, B and C negative. New medications within the week prior to  arrival, sitagliptin (0.5% risk liver injury) and ibuprofen 800mg  (rare as well, but usually occurs within 1-3 weeks of initiation). Tylenol level negative. - GI consulted-not a candidate for transplant in her current state.  - Follow LFT, Bilirubin, CMV PCR - [facility] following from distance. Call if needed  Acute renal failure: Hyponatremia, hypervolemic on exam. Multifactorial, possibly hepatorenal. - Additional 40mg  lasix x 1 today (UOP not great over past 24 hrs but renal function improving) - Trend BMP  Anemia of critical illness.  - Transfuse to maintain Hgb > 7  Diabetes type 2 - CBGs with SSI   CC time: 35 min.   Rutherford Guysahul Rayfield Beem, PA [facility] Dickens- [facility] [place] [facility] Pager: [phone].  If no answer, [phone] - [phone] 02/08/2019, 10:50 AM

## 2019-02-09 ENCOUNTER — Inpatient Hospital Stay (HOSPITAL_COMMUNITY): Payer: BC Managed Care – PPO

## 2019-02-09 LAB — CBC WITH DIFFERENTIAL/PLATELET
Abs Immature Granulocytes: 0.6 10*3/uL — ABNORMAL HIGH (ref 0.00–0.07)
Basophils Absolute: 0 10*3/uL (ref 0.0–0.1)
Basophils Relative: 0 %
Eosinophils Absolute: 0 10*3/uL (ref 0.0–0.5)
Eosinophils Relative: 0 %
HCT: 26.3 % — ABNORMAL LOW (ref 36.0–46.0)
Hemoglobin: 8.9 g/dL — ABNORMAL LOW (ref 12.0–15.0)
Lymphocytes Relative: 6 %
Lymphs Abs: 1.8 10*3/uL (ref 0.7–4.0)
MCH: 32.7 pg (ref 26.0–34.0)
MCHC: 33.8 g/dL (ref 30.0–36.0)
MCV: 96.7 fL (ref 80.0–100.0)
Metamyelocytes Relative: 2 %
Monocytes Absolute: 0.9 10*3/uL (ref 0.1–1.0)
Monocytes Relative: 3 %
Neutro Abs: 27.1 10*3/uL — ABNORMAL HIGH (ref 1.7–7.7)
Neutrophils Relative %: 89 %
Platelets: 127 10*3/uL — ABNORMAL LOW (ref 150–400)
RBC: 2.72 MIL/uL — ABNORMAL LOW (ref 3.87–5.11)
RDW: 15.9 % — ABNORMAL HIGH (ref 11.5–15.5)
WBC: 30.4 10*3/uL — ABNORMAL HIGH (ref 4.0–10.5)
nRBC: 4 % — ABNORMAL HIGH (ref 0.0–0.2)
nRBC: 6 /100 WBC — ABNORMAL HIGH

## 2019-02-09 LAB — COMPREHENSIVE METABOLIC PANEL
ALT: 51 U/L — ABNORMAL HIGH (ref 0–44)
AST: 266 U/L — ABNORMAL HIGH (ref 15–41)
Albumin: 1.8 g/dL — ABNORMAL LOW (ref 3.5–5.0)
Alkaline Phosphatase: 325 U/L — ABNORMAL HIGH (ref 38–126)
Anion gap: 13 (ref 5–15)
BUN: 88 mg/dL — ABNORMAL HIGH (ref 6–20)
CO2: 18 mmol/L — ABNORMAL LOW (ref 22–32)
Calcium: 8.2 mg/dL — ABNORMAL LOW (ref 8.9–10.3)
Chloride: 104 mmol/L (ref 98–111)
Creatinine, Ser: 3.33 mg/dL — ABNORMAL HIGH (ref 0.44–1.00)
GFR calc Af Amer: 17 mL/min — ABNORMAL LOW (ref >60)
GFR calc non Af Amer: 15 mL/min — ABNORMAL LOW (ref >60)
Glucose, Bld: 101 mg/dL — ABNORMAL HIGH (ref 70–99)
Potassium: 4.5 mmol/L (ref 3.5–5.1)
Sodium: 135 mmol/L (ref 135–145)
Total Bilirubin: 34.3 mg/dL (ref 0.3–1.2)
Total Protein: 5.2 g/dL — ABNORMAL LOW (ref 6.5–8.1)

## 2019-02-09 LAB — GLUCOSE, CAPILLARY
Glucose-Capillary: 89 mg/dL (ref 70–99)
Glucose-Capillary: 84 mg/dL (ref 70–99)
Glucose-Capillary: 93 mg/dL (ref 70–99)
Glucose-Capillary: 112 mg/dL — ABNORMAL HIGH (ref 70–99)
Glucose-Capillary: 121 mg/dL — ABNORMAL HIGH (ref 70–99)
Glucose-Capillary: 137 mg/dL — ABNORMAL HIGH (ref 70–99)

## 2019-02-09 LAB — CMV DNA, QUANTITATIVE, PCR
CMV DNA Quant: NEGATIVE IU/mL
Log10 CMV Qn DNA Pl: UNDETERMINED log10 IU/mL

## 2019-02-09 LAB — GAMMA GT: GGT: 1739 U/L — ABNORMAL HIGH (ref 7–50)

## 2019-02-09 MED ORDER — PRO-STAT SUGAR FREE PO LIQD
30.0000 mL | Freq: Three times a day (TID) | ORAL | Status: DC
  Administered 2019-02-09 – 2019-02-10 (×3): 30 mL
  Filled 2019-02-09 (×2): qty 30

## 2019-02-09 MED ORDER — OSMOLITE 1.5 CAL PO LIQD
1000.0000 mL | ORAL | Status: DC
  Administered 2019-02-09 – 2019-02-10 (×2): 1000 mL
  Filled 2019-02-09 (×3): qty 1000

## 2019-02-09 NOTE — Evaluation (Signed)
Clinical/Bedside Swallow Evaluation Patient Details  Name: Nazia Rhines MRN: [id number] Date of Birth: Mar 10, 1961  Today's Date: 02/09/2019 Time: SLP Start Time (ACUTE ONLY): 6967 SLP Stop Time (ACUTE ONLY): 0907 SLP Time Calculation (min) (ACUTE ONLY): 14 min  Past Medical History:  Past Medical History:  Diagnosis Date  . Diabetes mellitus without complication [facility])    Past Surgical History:  Past Surgical History:  Procedure Laterality Date  . CRANIOTOMY Left 02/06/2019   Procedure: CRANIOTOMY HEMATOMA EVACUATION SUBDURAL;  Surgeon: Eustace Moore, MD;  Location: [place];  Service: Neurosurgery;  Laterality: Left;   HPI:  Pt is a 58  y/o female presenting with jaundice, falls, and AMS. CT Head showed a large SDH along the L frontoparietal convexity with 68mm rightward midline shift, s/p evacuation 6/6 and complicated by L temporal IPH. Pt also found to have severe fatty infiltration of the liver. ETT 6/6-6/11. PMH: DM, GERD  Assessment / Plan / Recommendation Clinical Impression  Pt opens her mouth in anticipation of a bolus and begins mastication/oral transit spontaneously, although with generally slow movements. Dried secretions coat her oral cavity - oral care was performed with use of suction. She consistently triggers a swallow, followed by a weak spontaneous cough. Pt has potential to return to a PO diet, but at this time is at a high risk for aspiration in light of prolonged intubation, aphasia, and lethargy. She is following minimal commands and falls asleep at times with the bolus still in her mouth. A single, spontaneous vocalization was noted to be hoarse. Would consider a temporary alternative source of nutrition with additional SLP f/u for dysphagia. Would also consider ordering SLP cognitive-linguistic evaluation in the setting of her significant aphasia.   SLP Visit Diagnosis: Dysphagia, unspecified (R13.10)    Aspiration Risk  Moderate-severe aspiration risk    Diet  Recommendation NPO;Alternative means - temporary   Medication Administration: Via alternative means    Other  Recommendations Oral Care Recommendations: Oral care QID Other Recommendations: Have oral suction available   Follow up Recommendations (tba)      Frequency and Duration min 2x/week  2 weeks       Prognosis Prognosis for Safe Diet Advancement: Good Barriers to Reach Goals: Language deficits;Other (Comment)(overall medical status)      Swallow Study   General HPI: Pt is a 58  y/o female presenting with jaundice, falls, and AMS. CT Head showed a large SDH along the L frontoparietal convexity with 65mm rightward midline shift, s/p evacuation 6/6 and complicated by L temporal IPH. Pt also found to have severe fatty infiltration of the liver. ETT 6/6-6/11. PMH: DM Type of Study: Bedside Swallow Evaluation Previous Swallow Assessment: none in chart Diet Prior to this Study: NPO Temperature Spikes Noted: No Respiratory Status: Nasal cannula History of Recent Intubation: Yes Length of Intubations (days): 6 days Date extubated: 02/08/19 Behavior/Cognition: Lethargic/Drowsy;Doesn't follow directions Oral Cavity Assessment: Dried secretions(blood tinged) Oral Care Completed by SLP: Yes Oral Cavity - Dentition: Adequate natural dentition Self-Feeding Abilities: Total assist Patient Positioning: Upright in bed Baseline Vocal Quality: Hoarse(heard x1 during spontaneous vocalization) Volitional Cough: Cognitively unable to elicit Volitional Swallow: Unable to elicit    Oral/Motor/Sensory Function Overall Oral Motor/Sensory Function: Generalized oral weakness(difficulty following commands for assessment)   Ice Chips Ice chips: Impaired Presentation: Spoon Oral Phase Impairments: Reduced labial seal Oral Phase Functional Implications: Prolonged oral transit Pharyngeal Phase Impairments: Cough - Immediate;Decreased hyoid-laryngeal movement;Suspected delayed Swallow   Thin Liquid  Thin Liquid: Impaired Presentation: Spoon  Oral Phase Impairments: Reduced labial seal Pharyngeal  Phase Impairments: Decreased hyoid-laryngeal movement;Suspected delayed Swallow;Cough - Immediate;Cough - Delayed    Nectar Thick Nectar Thick Liquid: Not tested   Honey Thick Honey Thick Liquid: Not tested   Puree Puree: Not tested   Solid     Solid: Not tested      Virl AxeLaura P Johan Creveling 02/09/2019,9:18 AM  Ivar DrapeLaura Torrance Stockley, M.A. CCC-SLP [facility] Pager [phone] Office [phone]

## 2019-02-09 NOTE — Progress Notes (Signed)
Critical value:  Total Bilirubin 34.3 resulting from AM labs.  Dr. Jimmy Footman notified.  No new orders at this time.  Will continue to monitor.

## 2019-02-09 NOTE — Progress Notes (Signed)
Patient ID: Taylor Robinson, female   DOB: Sep 09, 1960, 58 y.o.   MRN: [id number] Opens eyes, regards me, localizes L, no FC, appears aphasic, incision CDI, extubated, following

## 2019-02-09 NOTE — Procedures (Signed)
Cortrak  Person Inserting Tube:  Lleyton Byers, RD Tube Type:  Cortrak - 43 inches Tube Location:  Right nare Initial Placement:  Stomach Secured by: Bridle Technique Used to Measure Tube Placement:  Documented cm marking at nare/ corner of mouth Cortrak Secured At:  61 cm   No x-ray is required. RN may begin using tube.   If the tube becomes dislodged please keep the tube and contact the Cortrak team at [email or web address] ([email or web address]) for replacement.  If after hours and replacement cannot be delayed, place a NG tube and confirm placement with an abdominal x-ray.    Laina Guerrieri RD, LDN Clinical Nutrition Pager # - [phone]    

## 2019-02-09 NOTE — Progress Notes (Signed)
Nutrition Follow-up RD working remotely.  DOCUMENTATION CODES:   Obesity unspecified  INTERVENTION:   Initiate  Osmolite 1.5 @ 55 ml/hr  30 ml Prostat TID  Provides: 2280 kcal, 127 grams protein, and 1005 ml free water  NUTRITION DIAGNOSIS:   Increased nutrient needs related to acute illness(liver failure, SDH) as evidenced by estimated needs. Ongoing.   GOAL:   Provide needs based on ASPEN/SCCM guidelines Progressing.   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    6/6 s/p L craniotomy for SDH GI following for liver failure. CT shows severe fatty infiltration of the liver 6/11 extubated 6/12 failed swallow eval, Cortrak placement pending    Medications reviewed  Labs reviewed: BUN: 88 (H), Cr 3.33 (H), Bilirubin: 34.3 (H) UOP: 630 ml I/O: +7 L - mild edema   NUTRITION - FOCUSED PHYSICAL EXAM:  Deferred   Diet Order:   Diet Order            Diet NPO time specified  Diet effective now              EDUCATION NEEDS:   No education needs have been identified at this time  Skin:  Skin Assessment: (MASD: abdomen, groin, breast, perineum, sacrum)  Last BM:  125 via rectal tube  Height:   Ht Readings from Last 1 Encounters:  02-Mar-2019 5\' 6"  (1.676 m)    Weight:   Wt Readings from Last 1 Encounters:  02/09/19 106 kg    Ideal Body Weight:  59 kg  BMI:  Body mass index is 37.72 kg/m.  Estimated Nutritional Needs:   Kcal:  2100-2300  Protein:  110-125 grams  Fluid:  > 1.5 L/day  Maylon Peppers RD, LDN, CNSC [phone] Pager [phone] After Hours Pager

## 2019-02-09 NOTE — Progress Notes (Signed)
NAME:  Taylor HammanDee Neidhardt, MRN:  [id number], DOB:  07/23/1961, LOS: 6 ADMISSION DATE:  02/24/2019, CONSULTATION DATE:  02/12/2019 REFERRING MD:  Dr. 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  Neurosurgery does not feel that emergent surgery is warranted at this time.  PCCM consulted for admit but with recommendations for early transfer to transplant center.   Past Medical History  Thyroid problems, GERD, DMT2  Significant Hospital Events    Consults:  NSGY  Procedures:  6/6 ETT > 6/11 6/6  L craniotomy for SDH.   Significant Diagnostic Tests:  6/5 CTH >>Large subdural hematoma along the left frontoparietal convexity measuring 2.6 cm in width and causing a 6 mm rightward midline shift. 6/5 CT abd/pelvis + 6/5  CT chest: No acute intrathoracic pathology. Underdistention of the ascending colon versus less likely mild Colitis. Severe fatty infiltration of the liver. CT head 6/7  reveals improvement in shift after evacuation, new focus of bleeding posterior temporal.   Micro Data:  6/5 SARS coronavirus 2 >> neg 6/5 BCx 2 >> neg 6/5 UC >> insignificant growth  Antimicrobials:   Interim history/subjective:  Extubated yesterday, tolerated well. Remains encephalopathic this AM.  Objective   Blood pressure (!) 132/49, pulse 79, temperature 99 F (37.2 C), temperature source Oral, resp. rate (!) 23, height 5\' 6"  (1.676 m), weight 106 kg, SpO2 100 %.    Vent Mode: PSV;CPAP FiO2 (%):  [30 %] 30 % PEEP:  [5 cmH20] 5 cmH20 Pressure Support:  [5 cmH20] 5 cmH20   Intake/Output Summary (Last 24 hours) at 02/09/2019 [id number]  Last data filed at 02/09/2019 0800 Gross per 24 hour  Intake 910 ml  Output 910 ml  Net 0 ml   Filed Weights   02/07/19 0500 02/08/19 0500 02/09/19 0500  Weight: 108.1 kg 106.5 kg 106 kg    Examination: General:  Morbidly obese jaundiced female, in NAD Neuro:  Minimally responsive HEENT:  [place]/AT, No appreciable JVD, scleral icterus Cardiovascular:  RRR, no MRG Lungs:  CTAB Abdomen:  Soft, non-distended Musculoskeletal:  No acute deformity. 2+ nonpitting lower extremity edema Skin:  Intact, MMM, significantly jaundiced  Assessment & Plan:   Acute on chronic subdural hematoma with midline shift s/p evacuation: Now complicated by L temporal IPH. Neurosurgery feels she will likely be aphasic, potentially permanently based on her injuries. Which, will make determining her level of encephalopathy very difficult - Management per neurosurgery - Dexamethasone being tapered - Keppra for sz ppx  Acute metabolic encephalopathy: hyperammonemia / hepatic, SDH, IPH - Continue lactulose and supportive care, hoping with time she will recover to the point of following commands, etc.  Acute hypoxemic respiratory failure requiring intubation and mechanical ventilation - s/p extubation 6/11. - Bronchial hygiene. - SLP eval.  Acute liver failure: Unclear etiology, gradually improving. ? Drug toxicity , Direct bilirubin 19, total bilirubin 31 on admission. Continuing to climb. CT imaging with severe fatty infiltration of the liver. Hepatitis A, B and C negative. New medications within the week prior to arrival, sitagliptin (0.5% risk liver injury) and ibuprofen 800mg  (rare as well, but usually occurs within 1-3 weeks of initiation).  Tylenol level negative.  Suspect obstructive pattern given improving intrinsic hepatic enzymes but very elevated bili and GGT. - GI consulted-not a candidate for transplant in her current state.  - Follow LFT, Bilirubin, CMV PCR - [facility] following from distance. Call if  needed - Assess RUQ [place]  Acute renal failure: Multifactorial, possibly hepatorenal.  Worsened after lasix. - Hold further lasix today - Trend BMP  Anemia of critical illness.  - Transfuse to maintain Hgb > 7  Diabetes type 2 - CBGs with SSI   CC time: 35 min.   Montey Hora, [facility] Pager: [phone].  If no answer, [phone] - [phone] 02/09/2019, 8:21 AM

## 2019-02-10 ENCOUNTER — Inpatient Hospital Stay (HOSPITAL_COMMUNITY): Payer: BC Managed Care – PPO

## 2019-02-10 DIAGNOSIS — J9601 Acute respiratory failure with hypoxia: Secondary | ICD-10-CM | POA: Diagnosis present

## 2019-02-10 DIAGNOSIS — Z9889 Other specified postprocedural states: Secondary | ICD-10-CM

## 2019-02-10 DIAGNOSIS — K7200 Acute and subacute hepatic failure without coma: Secondary | ICD-10-CM | POA: Diagnosis present

## 2019-02-10 DIAGNOSIS — N179 Acute kidney failure, unspecified: Secondary | ICD-10-CM

## 2019-02-10 LAB — CBC WITH DIFFERENTIAL/PLATELET
Abs Immature Granulocytes: 2.25 10*3/uL — ABNORMAL HIGH (ref 0.00–0.07)
Basophils Absolute: 0 10*3/uL (ref 0.0–0.1)
Basophils Relative: 0 %
Eosinophils Absolute: 0.1 10*3/uL (ref 0.0–0.5)
Eosinophils Relative: 0 %
HCT: 25.5 % — ABNORMAL LOW (ref 36.0–46.0)
Hemoglobin: 8.5 g/dL — ABNORMAL LOW (ref 12.0–15.0)
Immature Granulocytes: 9 %
Lymphocytes Relative: 4 %
Lymphs Abs: 0.9 10*3/uL (ref 0.7–4.0)
MCH: 32.9 pg (ref 26.0–34.0)
MCHC: 33.3 g/dL (ref 30.0–36.0)
MCV: 98.8 fL (ref 80.0–100.0)
Monocytes Absolute: 0.9 10*3/uL (ref 0.1–1.0)
Monocytes Relative: 3 %
Neutro Abs: 20.7 10*3/uL — ABNORMAL HIGH (ref 1.7–7.7)
Neutrophils Relative %: 84 %
Platelets: 89 10*3/uL — ABNORMAL LOW (ref 150–400)
RBC: 2.58 MIL/uL — ABNORMAL LOW (ref 3.87–5.11)
RDW: 16.8 % — ABNORMAL HIGH (ref 11.5–15.5)
WBC: 24.8 10*3/uL — ABNORMAL HIGH (ref 4.0–10.5)
nRBC: 2.1 % — ABNORMAL HIGH (ref 0.0–0.2)

## 2019-02-10 LAB — COMPREHENSIVE METABOLIC PANEL
ALT: 88 U/L — ABNORMAL HIGH (ref 0–44)
ALT: 69 U/L — ABNORMAL HIGH (ref 0–44)
AST: 100 U/L — ABNORMAL HIGH (ref 15–41)
AST: 279 U/L — ABNORMAL HIGH (ref 15–41)
Albumin: 1.7 g/dL — ABNORMAL LOW (ref 3.5–5.0)
Albumin: 1.6 g/dL — ABNORMAL LOW (ref 3.5–5.0)
Alkaline Phosphatase: 148 U/L — ABNORMAL HIGH (ref 38–126)
Alkaline Phosphatase: 399 U/L — ABNORMAL HIGH (ref 38–126)
Anion gap: 9 (ref 5–15)
Anion gap: 13 (ref 5–15)
BUN: 43 mg/dL — ABNORMAL HIGH (ref 6–20)
BUN: 103 mg/dL — ABNORMAL HIGH (ref 6–20)
CO2: 20 mmol/L — ABNORMAL LOW (ref 22–32)
CO2: 19 mmol/L — ABNORMAL LOW (ref 22–32)
Calcium: 7.9 mg/dL — ABNORMAL LOW (ref 8.9–10.3)
Calcium: 8.1 mg/dL — ABNORMAL LOW (ref 8.9–10.3)
Chloride: 113 mmol/L — ABNORMAL HIGH (ref 98–111)
Chloride: 105 mmol/L (ref 98–111)
Creatinine, Ser: 1.3 mg/dL — ABNORMAL HIGH (ref 0.44–1.00)
Creatinine, Ser: 3.96 mg/dL — ABNORMAL HIGH (ref 0.44–1.00)
GFR calc Af Amer: 53 mL/min — ABNORMAL LOW (ref >60)
GFR calc Af Amer: 14 mL/min — ABNORMAL LOW (ref >60)
GFR calc non Af Amer: 46 mL/min — ABNORMAL LOW (ref >60)
GFR calc non Af Amer: 12 mL/min — ABNORMAL LOW (ref >60)
Glucose, Bld: 145 mg/dL — ABNORMAL HIGH (ref 70–99)
Glucose, Bld: 171 mg/dL — ABNORMAL HIGH (ref 70–99)
Potassium: 3.8 mmol/L (ref 3.5–5.1)
Potassium: 4.8 mmol/L (ref 3.5–5.1)
Sodium: 142 mmol/L (ref 135–145)
Sodium: 137 mmol/L (ref 135–145)
Total Bilirubin: 3.9 mg/dL — ABNORMAL HIGH (ref 0.3–1.2)
Total Bilirubin: 33.8 mg/dL (ref 0.3–1.2)
Total Protein: 5.7 g/dL — ABNORMAL LOW (ref 6.5–8.1)
Total Protein: 4.9 g/dL — ABNORMAL LOW (ref 6.5–8.1)

## 2019-02-10 LAB — GLUCOSE, CAPILLARY
Glucose-Capillary: 143 mg/dL — ABNORMAL HIGH (ref 70–99)
Glucose-Capillary: 157 mg/dL — ABNORMAL HIGH (ref 70–99)
Glucose-Capillary: 135 mg/dL — ABNORMAL HIGH (ref 70–99)
Glucose-Capillary: 128 mg/dL — ABNORMAL HIGH (ref 70–99)
Glucose-Capillary: 114 mg/dL — ABNORMAL HIGH (ref 70–99)
Glucose-Capillary: 103 mg/dL — ABNORMAL HIGH (ref 70–99)

## 2019-02-10 LAB — POCT I-STAT 7, (LYTES, BLD GAS, ICA,H+H)
Acid-base deficit: 6 mmol/L — ABNORMAL HIGH (ref 0.0–2.0)
Bicarbonate: 20.9 mmol/L (ref 20.0–28.0)
Calcium, Ion: 1.16 mmol/L (ref 1.15–1.40)
HCT: 41 % (ref 36.0–46.0)
Hemoglobin: 13.9 g/dL (ref 12.0–15.0)
O2 Saturation: 97 %
Patient temperature: 98.6
Potassium: 5.1 mmol/L (ref 3.5–5.1)
Sodium: 139 mmol/L (ref 135–145)
TCO2: 22 mmol/L (ref 22–32)
pCO2 arterial: 46.4 mmHg (ref 32.0–48.0)
pH, Arterial: 7.262 — ABNORMAL LOW (ref 7.350–7.450)
pO2, Arterial: 110 mmHg — ABNORMAL HIGH (ref 83.0–108.0)

## 2019-02-10 LAB — MAGNESIUM
Magnesium: 2.2 mg/dL (ref 1.7–2.4)
Magnesium: 2.3 mg/dL (ref 1.7–2.4)

## 2019-02-10 LAB — PHOSPHORUS
Phosphorus: 3.7 mg/dL (ref 2.5–4.6)
Phosphorus: 4.2 mg/dL (ref 2.5–4.6)

## 2019-02-10 LAB — PROTIME-INR
INR: 1.3 — ABNORMAL HIGH (ref 0.8–1.2)
Prothrombin Time: 15.8 seconds — ABNORMAL HIGH (ref 11.4–15.2)

## 2019-02-10 LAB — AMMONIA: Ammonia: 107 umol/L — ABNORMAL HIGH (ref 9–35)

## 2019-02-10 MED ORDER — ALBUMIN HUMAN 25 % IV SOLN
50.0000 g | Freq: Every day | INTRAVENOUS | Status: DC
  Administered 2019-02-10 – 2019-02-11 (×2): 50 g via INTRAVENOUS
  Filled 2019-02-10: qty 100
  Filled 2019-02-10: qty 50

## 2019-02-10 MED ORDER — MIDAZOLAM HCL 2 MG/2ML IJ SOLN
2.0000 mg | INTRAMUSCULAR | Status: DC | PRN
  Administered 2019-02-13 – 2019-02-15 (×3): 2 mg via INTRAVENOUS
  Filled 2019-02-10 (×3): qty 2

## 2019-02-10 MED ORDER — MIDAZOLAM HCL 2 MG/2ML IJ SOLN
INTRAMUSCULAR | Status: AC
  Administered 2019-02-10: 12:00:00 2 mg
  Filled 2019-02-10: qty 2

## 2019-02-10 MED ORDER — ROCURONIUM BROMIDE 50 MG/5ML IV SOLN
70.0000 mg | Freq: Once | INTRAVENOUS | Status: AC
  Administered 2019-02-10: 12:00:00 70 mg via INTRAVENOUS

## 2019-02-10 MED ORDER — FENTANYL CITRATE (PF) 100 MCG/2ML IJ SOLN
50.0000 ug | INTRAMUSCULAR | Status: DC | PRN
  Administered 2019-02-11 – 2019-02-13 (×5): 100 ug via INTRAVENOUS
  Administered 2019-02-13: 50 ug via INTRAVENOUS
  Administered 2019-02-14 – 2019-02-15 (×5): 100 ug via INTRAVENOUS
  Filled 2019-02-10 (×10): qty 2

## 2019-02-10 MED ORDER — NOREPINEPHRINE 4 MG/250ML-% IV SOLN
0.0000 ug/min | INTRAVENOUS | Status: DC
  Administered 2019-02-10: 22:00:00 9 ug/min via INTRAVENOUS
  Administered 2019-02-10: 2 ug/min via INTRAVENOUS
  Administered 2019-02-11: 16 ug/min via INTRAVENOUS
  Administered 2019-02-11 (×2): 14 ug/min via INTRAVENOUS
  Filled 2019-02-10: qty 500
  Filled 2019-02-10 (×3): qty 250

## 2019-02-10 MED ORDER — PRO-STAT SUGAR FREE PO LIQD
30.0000 mL | Freq: Two times a day (BID) | ORAL | Status: DC
  Administered 2019-02-10 – 2019-02-13 (×6): 30 mL
  Filled 2019-02-10 (×6): qty 30

## 2019-02-10 MED ORDER — FENTANYL CITRATE (PF) 100 MCG/2ML IJ SOLN: 50.0000 ug | Freq: Once | INTRAMUSCULAR | Status: AC

## 2019-02-10 MED ORDER — FENTANYL CITRATE (PF) 100 MCG/2ML IJ SOLN
INTRAMUSCULAR | Status: AC
  Administered 2019-02-10: 100 ug
  Filled 2019-02-10: qty 2

## 2019-02-10 MED ORDER — MIDAZOLAM HCL 2 MG/2ML IJ SOLN
2.0000 mg | INTRAMUSCULAR | Status: AC | PRN
  Administered 2019-02-13 – 2019-02-14 (×3): 2 mg via INTRAVENOUS
  Filled 2019-02-10 (×3): qty 2

## 2019-02-10 MED ORDER — FENTANYL CITRATE (PF) 100 MCG/2ML IJ SOLN
50.0000 ug | INTRAMUSCULAR | Status: DC | PRN
  Filled 2019-02-10: qty 2

## 2019-02-10 MED ORDER — VITAL HIGH PROTEIN PO LIQD
1000.0000 mL | ORAL | Status: DC
  Administered 2019-02-11 – 2019-02-12 (×2): 1000 mL

## 2019-02-10 MED ORDER — MIDAZOLAM HCL 2 MG/2ML IJ SOLN: 2.0000 mg | Freq: Once | INTRAMUSCULAR | Status: AC

## 2019-02-10 MED ORDER — VITAL HIGH PROTEIN PO LIQD
1000.0000 mL | ORAL | Status: DC
  Administered 2019-02-10: 1000 mL

## 2019-02-10 MED ORDER — LACTATED RINGERS IV BOLUS
1000.0000 mL | Freq: Once | INTRAVENOUS | Status: AC
  Administered 2019-02-10: 11:00:00 1000 mL via INTRAVENOUS

## 2019-02-10 MED ORDER — LEVETIRACETAM 100 MG/ML PO SOLN
500.0000 mg | Freq: Two times a day (BID) | ORAL | Status: DC
  Administered 2019-02-10 – 2019-02-15 (×11): 500 mg
  Filled 2019-02-10 (×12): qty 5

## 2019-02-10 MED ORDER — ETOMIDATE 2 MG/ML IV SOLN
10.0000 mg | Freq: Once | INTRAVENOUS | Status: AC
  Administered 2019-02-10: 12:00:00 10 mg via INTRAVENOUS

## 2019-02-10 NOTE — Progress Notes (Addendum)
Nutrition Follow-up  DOCUMENTATION CODES:   Obesity unspecified  INTERVENTION:   Tube feeding:  Vital High Protein @ 50 ml/hr via Cortrak (1200 ml) 30 ml Prostat BID  Provides: 1400 kcal, 135 grams protein, and 1003 ml free water. Meets 100% of needs.   NUTRITION DIAGNOSIS:   Increased nutrient needs related to acute illness(liver failure, SDH) as evidenced by estimated needs.  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    6/6 s/p L craniotomy for SDH GI following for liver failure. CT shows severe fatty infiltration of the liver 6/11 extubated 6/12 failed swallow eval, gastric Cortrak placement  6/13- re-intubated   RD working remotely.  Per MD, pt not a liver transplant candidate. GOC to be discussed.   [place] 1.5 started yesterday after Cortrak placement, d/c by MD this am. Vital High Protein ordered at 40 ml/hr + 30 ml Prostat. RD to adjust needs for re-intubation and make adjustments to TF rate.   Admission weight: 103.1 kg (will use to estimate needs) Current weight 106 kg  Generalized +1 mild pitting edema noted on nursing exam.   Patient is currently intubated on ventilator support MV: 8.3 L/min Temp (24hrs), Avg:98.4 F (36.9 C), Min:97 F (36.1 C), Max:99.3 F (37.4 C)  I/O: +9,087 ml since admit UOP: 475 ml x 24 hrs  Rectal tube: 50 ml x 24 hrs   Drips: albumin, levophed Medications: metamucil  Labs: Cr 3.96- trending up  LFTs elevated CBG 89-173  Diet Order:   Diet Order            Diet NPO time  specified  Diet effective now              EDUCATION NEEDS:   No education needs have been identified at this time  Skin:  Skin Assessment: (MASD: abdomen, groin, breast, perineum, sacrum)  Last BM:  6/13- 50 ml via rectal tube  Height:   Ht Readings from Last 1 Encounters:  02/10/19 5\' 6"  (1.676 m)    Weight:   Wt Readings from Last 1 Encounters:  02/10/19 106 kg    Ideal Body Weight:  59 kg  BMI:  Body mass index is 37.72 kg/m.  Estimated Nutritional Needs:   Kcal:  1134-1443 kcal  Protein:  118-148 gram  Fluid:  >/= 1.5 L/day   Mariana Single RD, LDN Clinical Nutrition Pager # - [phone]

## 2019-02-10 NOTE — Progress Notes (Signed)
Patient intubated by MD, good color change on ETCO2 detector, goo BBS ausculted, SATS 100%, placed on above vent settings per ARDS net protocol.

## 2019-02-10 NOTE — Progress Notes (Signed)
PT Cancellation Note  Patient Details Name: Taylor Robinson MRN: [id number] DOB: Dec 17, 1960   Cancelled Treatment:    Reason Eval/Treat Not Completed: Active bedrest order   Ellamae Sia, PT, DPT Acute Rehabilitation Services Pager [phone] Office [phone]    Willy Eddy 02/10/2019, 8:00 AM

## 2019-02-10 NOTE — Progress Notes (Signed)
OT Cancellation Note  Patient Details Name: Taylor Robinson MRN: [id number] DOB: [birth year]   Cancelled Treatment:    Reason Eval/Treat Not Completed: Active bedrest order.  Will reattempt  Lucille Passy, OTR/L [place] Pager [phone] Office [phone]   Lucille Passy M 02/10/2019, 9:43 AM

## 2019-02-10 NOTE — Progress Notes (Signed)
SLP Cancellation Note  Patient Details Name: Taylor Robinson MRN: [id number] DOB: Jan 23, 1961   Cancelled treatment:       Reason Eval/Treat Not Completed: Patient not medically ready. Patient was intubated today. Will continue to follow for readiness.   Taylor Robinson 02/10/2019, 12:25 PM  Sonia Baller, MA, CCC-SLP Speech Therapy [facility] Acute Rehab Pager: [phone]

## 2019-02-10 NOTE — Procedures (Signed)
Intubation Procedure Note Taylor Robinson [id number] 1960/12/23  Procedure: Intubation Indications: Airway protection and maintenance  Procedure Details Consent: Risks of procedure as well as the alternatives and risks of each were explained to the (patient/caregiver).  Consent for procedure obtained. Time Out: Verified patient identification, verified procedure, site/side was marked, verified correct patient position, special equipment/implants available, medications/allergies/relevent history reviewed, required imaging and test results available.  Performed  Drugs Etomdiate 20mg , Versed 2mg , Fentanyl 83mcg, Rocuronium 80mg  IV DL x 1 with GS3 blade Grade 1 view 7.5 ET tube passed through cords under direct visualization Placement confirmed with bilateral breath sounds, positive EtCO2 change and smoke in tube   Evaluation Hemodynamic Status: BP stable throughout; O2 sats: stable throughout Patient's Current Condition: stable Complications: No apparent complications Patient did tolerate procedure well. Chest X-ray ordered to verify placement.  CXR: pending.   Taylor Robinson 02/10/2019

## 2019-02-10 NOTE — Procedures (Signed)
Central Venous Catheter Insertion Procedure Note Taylor Robinson [id number] 09/29/1960  Procedure: Insertion of Central Venous Catheter Indications: Assessment of intravascular volume  Procedure Details Consent: Risks of procedure as well as the alternatives and risks of each were explained to the (patient/caregiver).  Consent for procedure obtained. Time Out: Verified patient identification, verified procedure, site/side was marked, verified correct patient position, special equipment/implants available, medications/allergies/relevent history reviewed, required imaging and test results available.  Performed  Maximum sterile technique was used including antiseptics, cap, gloves, gown, hand hygiene, mask and sheet. Skin prep: Chlorhexidine; local anesthetic administered A antimicrobial bonded/coated triple lumen catheter was placed in the left internal jugular vein using the Seldinger technique.  Ultrasound was used to verify the patency of the vein and for real time needle guidance.  Evaluation Blood flow good Complications: No apparent complications Patient did tolerate procedure well. Chest X-ray ordered to verify placement.  CXR: pending.  Taylor Robinson 02/10/2019, 12:03 PM

## 2019-02-10 NOTE — Progress Notes (Signed)
ETT pulled back 3cm from 23 cm at lip to now 20cm at lip per MD's order.

## 2019-02-10 NOTE — Progress Notes (Signed)
  NEUROSURGERY PROGRESS NOTE   No issues overnight.   EXAM:  BP (!) 113/54   Pulse 84   Temp 98.5 F (36.9 C) (Axillary)   Resp (!) 26   Ht 5\' 6"  (1.676 m)   Wt 106 kg   SpO2 98%   BMI 37.72 kg/m   Opens eyes to voice Pupils reactive Not following commands, Localizes BUE, w/d BLE Wound c/d/i  IMPRESSION:  58 y.o. female POD# 7 Left crani for SDH, remains obtunded but stable  PLAN: - Cont supportive care

## 2019-02-10 NOTE — Progress Notes (Signed)
NAME:  Taylor Robinson, MRN:  [id number], DOB:  August 01, 1961, LOS: 7 ADMISSION DATE:  02/14/2019, CONSULTATION DATE:  02/25/2019 REFERRING MD:  Rex Kras, CHIEF COMPLAINT:  Confusion   Brief History   59 y/o female admitted form home with jaundice, nausea and vomiting which developed over weeks, eventually developed confusion and found to have a subdural hemorrhage.  Required hematoma evacuation by neurosurgery on 6/6.    Past Medical History  Hypothyroidism, DM2, GERD  Significant Hospital Events   6/6 craniotomy 6/11 extubated  Consults:  NSGY GI  Procedures:  6/6 ETT > 6/11 6/6 L Craniotomy for subdural hematoma  Significant Diagnostic Tests:  6/5 CTH >>Large subdural hematoma along the left frontoparietal convexity measuring 2.6 cm in width and causing a 6 mm rightward midline shift. 6/5 CT abd/pelvis + 6/5  CT chest: No acute intrathoracic pathology. Underdistention of the ascending colon versus less likely mild Colitis. Severe fatty infiltration of the liver. 6/6 RUQ ultrasound > steatosis, gludge CT head 6/7  reveals improvement in shift after evacuation, new focus of bleeding posterior temporal.  6/12 RUQ ultrasound > sludge in gallbladder lumen, hepatic steatosis  Micro Data:  6/5 SARS coronavirus 2 >> neg 6/5 BCx 2 >> neg 6/5 UC >> insignificant growth  Antimicrobials:  6/6 cefazolin x1  Interim history/subjective:  WBC elevated INR OK Bili higher today Mental status worse   Objective   Blood pressure (!) 113/54, pulse 84, temperature 98.5 F (36.9 C), temperature source Axillary, resp. rate (!) 26, height '5\' 6"'  (1.676 m), weight 106 kg, SpO2 98 %.        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 s/p evacuation Per Neuro surgery Keppra  Acute metabolic encephalopathy: presumably hyperammonemia, ddx broad Check ammonia again from arterial line  Acute respiratory failure due to inability to protect airway: worse Intubate now Full vent support VAP prevention  Acute liver failure> cholestatic pattern > worse Bili and Alk phos; has been noted to have abnormal LFT's since 06/2018; broad DDX, consider drug effect, primary biliary cirrhosis, primary sclerosing cholangitis, sarcoid, amyloid Would consider transjugular hepatic needle biopsy F/u CMV Check TSH  AKI> worse, consider hepato renal Place CVL LR bolus now, monitor UOP If no UOP increase, start levophed titrated to MAP > 80 Albumin 50gm now IV and daily   Best practice:  Diet: start tube feeding after intubation Pain/Anxiety/Delirium protocol (if indicated): yes, prn fentanyl, versed, RASS target 0 to -1 VAP protocol (if indicated): yes DVT prophylaxis: SCD GI prophylaxis: PPI Glucose control: SSI Mobility: bed rest Code Status: full Family Communication: updated her husband Louie Casa on 6/13, they are separated.  He really doesn't have much clue about her prognosis.  I let him know that I think it is terrible and she is not a liver transplant candidate.  He voiced understanding.  He says her adult children are  not really in a place to make decisions about her condition.  He recommended we proceed with intubaiton and supportive care and he would start talking to her kids about goals of care. Disposition: remain in ICU  Labs   CBC: Recent Labs  Lab 02/06/19 0500 02/06/19 1819 02/07/19 0448 02/08/19 0515 02/09/19 0500 02/10/19  0613  WBC 14.2*  --  21.4* 24.2* 30.4* 24.8*  NEUTROABS 10.2*  --  15.0* 19.6* 27.1* 20.7*  HGB 6.8* 8.5* 8.8* 8.8* 8.9* 8.5*  HCT 20.1* 24.9* 25.1* 25.5* 26.3* 25.5*  MCV 97.1  --  95.8 96.2 96.7 98.8  PLT 150  --  153 142* 127* 89*    Basic Metabolic Panel: Recent Labs  Lab 02/14/2019 1434  02/04/19 0444  02/06/19 0500 02/07/19 0448 02/08/19 0515 02/09/19 0500 02/10/19 0613  NA 119*   < > 122*   < > 130* 131* 142 135 137  K 3.0*   < > 3.5   < > 3.8 4.1 3.8 4.5 4.8  CL 82*   < > 87*   < > 98 101 113* 104 105  CO2 21*   < > 21*   < > 19* 17* 20* 18* 19*  GLUCOSE 115*   < > 132*   < > 158* 165* 145* 101* 171*  BUN 17   < > 16   < > 40* 50* 43* 88* 103*  CREATININE 1.33*   < > 1.26*   < > 1.87* 2.14* 1.30* 3.33* 3.96*  CALCIUM 7.9*   < > 7.8*   < > 7.5* 7.5* 7.9* 8.2* 8.1*  MG 2.0  --  2.0  --  2.3 2.3 2.5*  --   --   PHOS 3.3  --  2.7  --  2.4* 3.7 3.1  --   --    < > = values in this interval not displayed.   GFR: Estimated Creatinine Clearance: 19.5 mL/min (A) (by C-G formula based on SCr of 3.96 mg/dL (H)). Recent Labs  Lab 02/07/19 0448 02/08/19 0515 02/09/19 0500 02/10/19 0613  WBC 21.4* 24.2* 30.4* 24.8*    Liver Function Tests: Recent Labs  Lab 02/06/19 0500 02/07/19 0448 02/08/19 0515 02/09/19 0500 02/10/19 0613  AST 153* 190* 100* 266* 279*  ALT 26 31 88* 51* 69*  ALKPHOS 206* 239* 148* 325* 399*  BILITOT 29.2* 25.5* 3.9* 34.3* 33.8*  PROT 5.1* 5.5* 5.7* 5.2* 4.9*  ALBUMIN 2.1* 2.2* 1.7* 1.8* 1.6*   No results for input(s): LIPASE, AMYLASE in the last 168 hours. Recent Labs  Lab 01/29/2019 1826 02/04/19 1253 02/04/19 1808 02/05/19 0503  AMMONIA 121* 52* 52* 53*    ABG    Component Value Date/Time   PHART 7.500 (H) 02/04/2019 0350   PCO2ART 30.1 (L) 02/04/2019 0350   PO2ART 121 (H) 02/04/2019 0350   HCO3 23.0 02/04/2019 0350   TCO2 25 02/07/2019 1033   ACIDBASEDEF 1.0 01/31/2019 1807   O2SAT 98.6 02/04/2019 0350     Coagulation  Profile: Recent Labs  Lab 02/04/19 1808 02/05/19 0503 02/06/19 0500 02/07/19 0448 02/10/19 0613  INR 1.3* 1.2 1.3* 1.2 1.3*    Cardiac Enzymes: No results for input(s): CKTOTAL, CKMB, CKMBINDEX, TROPONINI in the last 168 hours.  HbA1C: No results found for: HGBA1C  CBG: Recent Labs  Lab 02/09/19 1556 02/09/19 1927 02/09/19 2323 02/10/19 0321 02/10/19 0748  GLUCAP 112* 121* 137* 143* 157*     Critical care time: 40 minutes  Roselie Awkward, MD [facility] Pager: [phone] Cell: [phone] If no response, call [phone]

## 2019-02-11 ENCOUNTER — Encounter (HOSPITAL_COMMUNITY): Payer: Self-pay | Admitting: Nephrology

## 2019-02-11 ENCOUNTER — Inpatient Hospital Stay (HOSPITAL_COMMUNITY): Payer: BC Managed Care – PPO

## 2019-02-11 LAB — CBC WITH DIFFERENTIAL/PLATELET
Abs Immature Granulocytes: 1.49 10*3/uL — ABNORMAL HIGH (ref 0.00–0.07)
Basophils Absolute: 0 10*3/uL (ref 0.0–0.1)
Basophils Relative: 0 %
Eosinophils Absolute: 0.1 10*3/uL (ref 0.0–0.5)
Eosinophils Relative: 0 %
HCT: 25.4 % — ABNORMAL LOW (ref 36.0–46.0)
Hemoglobin: 8.5 g/dL — ABNORMAL LOW (ref 12.0–15.0)
Immature Granulocytes: 7 %
Lymphocytes Relative: 3 %
Lymphs Abs: 0.6 10*3/uL — ABNORMAL LOW (ref 0.7–4.0)
MCH: 33.1 pg (ref 26.0–34.0)
MCHC: 33.5 g/dL (ref 30.0–36.0)
MCV: 98.8 fL (ref 80.0–100.0)
Monocytes Absolute: 0.6 10*3/uL (ref 0.1–1.0)
Monocytes Relative: 3 %
Neutro Abs: 17.7 10*3/uL — ABNORMAL HIGH (ref 1.7–7.7)
Neutrophils Relative %: 87 %
Platelets: 82 10*3/uL — ABNORMAL LOW (ref 150–400)
RBC: 2.57 MIL/uL — ABNORMAL LOW (ref 3.87–5.11)
RDW: 17.5 % — ABNORMAL HIGH (ref 11.5–15.5)
WBC Morphology: INCREASED
WBC: 20.4 10*3/uL — ABNORMAL HIGH (ref 4.0–10.5)
nRBC: 1.4 % — ABNORMAL HIGH (ref 0.0–0.2)

## 2019-02-11 LAB — GLUCOSE, CAPILLARY
Glucose-Capillary: 113 mg/dL — ABNORMAL HIGH (ref 70–99)
Glucose-Capillary: 120 mg/dL — ABNORMAL HIGH (ref 70–99)
Glucose-Capillary: 133 mg/dL — ABNORMAL HIGH (ref 70–99)
Glucose-Capillary: 111 mg/dL — ABNORMAL HIGH (ref 70–99)
Glucose-Capillary: 130 mg/dL — ABNORMAL HIGH (ref 70–99)
Glucose-Capillary: 138 mg/dL — ABNORMAL HIGH (ref 70–99)

## 2019-02-11 LAB — COMPREHENSIVE METABOLIC PANEL
ALT: 67 U/L — ABNORMAL HIGH (ref 0–44)
AST: 232 U/L — ABNORMAL HIGH (ref 15–41)
Albumin: 1.7 g/dL — ABNORMAL LOW (ref 3.5–5.0)
Alkaline Phosphatase: 433 U/L — ABNORMAL HIGH (ref 38–126)
Anion gap: 14 (ref 5–15)
BUN: 115 mg/dL — ABNORMAL HIGH (ref 6–20)
CO2: 17 mmol/L — ABNORMAL LOW (ref 22–32)
Calcium: 8.3 mg/dL — ABNORMAL LOW (ref 8.9–10.3)
Chloride: 104 mmol/L (ref 98–111)
Creatinine, Ser: 4.25 mg/dL — ABNORMAL HIGH (ref 0.44–1.00)
GFR calc Af Amer: 13 mL/min — ABNORMAL LOW (ref >60)
GFR calc non Af Amer: 11 mL/min — ABNORMAL LOW (ref >60)
Glucose, Bld: 122 mg/dL — ABNORMAL HIGH (ref 70–99)
Potassium: 5.1 mmol/L (ref 3.5–5.1)
Sodium: 135 mmol/L (ref 135–145)
Total Bilirubin: 34.2 mg/dL (ref 0.3–1.2)
Total Protein: 5.2 g/dL — ABNORMAL LOW (ref 6.5–8.1)

## 2019-02-11 LAB — URINALYSIS, ROUTINE W REFLEX MICROSCOPIC
Glucose, UA: NEGATIVE mg/dL
Ketones, ur: NEGATIVE mg/dL
Nitrite: NEGATIVE
Protein, ur: 30 mg/dL — AB
Specific Gravity, Urine: 1.012 (ref 1.005–1.030)
WBC, UA: >50 WBC/hpf — ABNORMAL HIGH (ref 0–5)
pH: 5 (ref 5.0–8.0)

## 2019-02-11 LAB — PROTIME-INR
INR: 1.2 (ref 0.8–1.2)
Prothrombin Time: 15.3 seconds — ABNORMAL HIGH (ref 11.4–15.2)

## 2019-02-11 LAB — POCT I-STAT 7, (LYTES, BLD GAS, ICA,H+H)
Acid-base deficit: 4 mmol/L — ABNORMAL HIGH (ref 0.0–2.0)
Bicarbonate: 19.9 mmol/L — ABNORMAL LOW (ref 20.0–28.0)
Calcium, Ion: 1.14 mmol/L — ABNORMAL LOW (ref 1.15–1.40)
HCT: 25 % — ABNORMAL LOW (ref 36.0–46.0)
Hemoglobin: 8.5 g/dL — ABNORMAL LOW (ref 12.0–15.0)
O2 Saturation: 99 %
Patient temperature: 98.6
Potassium: 5.5 mmol/L — ABNORMAL HIGH (ref 3.5–5.1)
Sodium: 139 mmol/L (ref 135–145)
TCO2: 21 mmol/L — ABNORMAL LOW (ref 22–32)
pCO2 arterial: 30.8 mmHg — ABNORMAL LOW (ref 32.0–48.0)
pH, Arterial: 7.418 (ref 7.350–7.450)
pO2, Arterial: 159 mmHg — ABNORMAL HIGH (ref 83.0–108.0)

## 2019-02-11 LAB — CREATININE, URINE, RANDOM: Creatinine, Urine: 45.76 mg/dL

## 2019-02-11 LAB — RETICULOCYTES
Immature Retic Fract: 37.7 % — ABNORMAL HIGH (ref 2.3–15.9)
RBC.: 2.54 MIL/uL — ABNORMAL LOW (ref 3.87–5.11)
Retic Count, Absolute: 180.8 10*3/uL (ref 19.0–186.0)
Retic Ct Pct: 7.1 % — ABNORMAL HIGH (ref 0.4–3.1)

## 2019-02-11 LAB — MAGNESIUM: Magnesium: 2.3 mg/dL (ref 1.7–2.4)

## 2019-02-11 LAB — TSH: TSH: 0.014 u[IU]/mL — ABNORMAL LOW (ref 0.350–4.500)

## 2019-02-11 LAB — SODIUM, URINE, RANDOM: Sodium, Ur: 29 mmol/L

## 2019-02-11 LAB — PHOSPHORUS: Phosphorus: 4.2 mg/dL (ref 2.5–4.6)

## 2019-02-11 LAB — TECHNOLOGIST SMEAR REVIEW

## 2019-02-11 MED ORDER — ROCURONIUM BROMIDE 50 MG/5ML IV SOLN
50.0000 mg | Freq: Once | INTRAVENOUS | Status: AC
  Administered 2019-02-11: 16:00:00 50 mg via INTRAVENOUS

## 2019-02-11 MED ORDER — SODIUM ZIRCONIUM CYCLOSILICATE 10 G PO PACK
10.0000 g | PACK | Freq: Three times a day (TID) | ORAL | Status: DC
  Administered 2019-02-11 (×2): 10 g via ORAL
  Filled 2019-02-11 (×3): qty 1

## 2019-02-11 MED ORDER — NOREPINEPHRINE 16 MG/250ML-% IV SOLN
0.0000 ug/min | INTRAVENOUS | Status: DC
  Administered 2019-02-11: 10 ug/min via INTRAVENOUS
  Administered 2019-02-13: 32 ug/min via INTRAVENOUS
  Administered 2019-02-14: 27 ug/min via INTRAVENOUS
  Administered 2019-02-14: 28 ug/min via INTRAVENOUS
  Administered 2019-02-15 (×3): 40 ug/min via INTRAVENOUS
  Filled 2019-02-11 (×6): qty 250

## 2019-02-11 MED ORDER — PRISMASOL BGK 4/2.5 32-4-2.5 MEQ/L IV SOLN
INTRAVENOUS | Status: DC
  Administered 2019-02-11 – 2019-02-15 (×29): via INTRAVENOUS_CENTRAL
  Filled 2019-02-11 (×4): qty 5000
  Filled 2019-02-11: qty 10000
  Filled 2019-02-11 (×8): qty 5000
  Filled 2019-02-11: qty 25000
  Filled 2019-02-11 (×5): qty 5000
  Filled 2019-02-11: qty 10000
  Filled 2019-02-11 (×7): qty 5000
  Filled 2019-02-11: qty 25000
  Filled 2019-02-11 (×2): qty 5000
  Filled 2019-02-11: qty 25000
  Filled 2019-02-11: qty 5000
  Filled 2019-02-11: qty 25000
  Filled 2019-02-11 (×7): qty 5000
  Filled 2019-02-11: qty 25000
  Filled 2019-02-11 (×6): qty 5000

## 2019-02-11 MED ORDER — PRISMASOL BGK 4/2.5 32-4-2.5 MEQ/L REPLACEMENT SOLN
Status: DC
  Administered 2019-02-11 – 2019-02-15 (×8): via INTRAVENOUS_CENTRAL
  Filled 2019-02-11 (×10): qty 5000

## 2019-02-11 MED ORDER — HEPARIN SODIUM (PORCINE) 1000 UNIT/ML DIALYSIS
1000.0000 [IU] | INTRAMUSCULAR | Status: DC | PRN
  Administered 2019-02-13: 2400 [IU] via INTRAVENOUS_CENTRAL
  Filled 2019-02-11 (×2): qty 6

## 2019-02-11 MED ORDER — SODIUM BICARBONATE 8.4 % IV SOLN
INTRAVENOUS | Status: DC
  Administered 2019-02-11: 17:00:00 via INTRAVENOUS
  Filled 2019-02-11: qty 150

## 2019-02-11 MED ORDER — SODIUM CHLORIDE 0.9% FLUSH: 10.0000 mL | INTRAVENOUS | Status: DC | PRN

## 2019-02-11 MED ORDER — LACTULOSE 10 GM/15ML PO SOLN
10.0000 g | Freq: Two times a day (BID) | ORAL | Status: DC
  Administered 2019-02-11 – 2019-02-15 (×9): 10 g
  Filled 2019-02-11 (×9): qty 15

## 2019-02-11 MED ORDER — SODIUM ZIRCONIUM CYCLOSILICATE 10 G PO PACK
10.0000 g | PACK | Freq: Once | ORAL | Status: AC
  Administered 2019-02-11: 10 g via ORAL
  Filled 2019-02-11: qty 1

## 2019-02-11 MED ORDER — SODIUM CHLORIDE 0.9 % FOR CRRT
INTRAVENOUS_CENTRAL | Status: DC | PRN
  Filled 2019-02-11: qty 1000

## 2019-02-11 MED ORDER — PRISMASOL BGK 4/2.5 32-4-2.5 MEQ/L REPLACEMENT SOLN
Status: DC
  Administered 2019-02-11 – 2019-02-15 (×12): via INTRAVENOUS_CENTRAL
  Filled 2019-02-11 (×15): qty 5000

## 2019-02-11 MED ORDER — ALTEPLASE 2 MG IJ SOLR: 2.0000 mg | Freq: Once | INTRAMUSCULAR | Status: DC | PRN

## 2019-02-11 MED ORDER — SODIUM CHLORIDE 0.9% FLUSH
10.0000 mL | Freq: Two times a day (BID) | INTRAVENOUS | Status: DC
  Administered 2019-02-12: 20 mL
  Administered 2019-02-12 – 2019-02-13 (×3): 10 mL
  Administered 2019-02-13: 20 mL
  Administered 2019-02-14 (×2): 10 mL
  Administered 2019-02-15: 40 mL

## 2019-02-11 NOTE — Procedures (Addendum)
Hemodialysis Catheter Insertion Procedure Note Taylor Robinson [id number] 04-29-1961 Location o- bed 4n21  Procedure: Insertion of Hemodialysis Catheter Indications: Dialysis Access   Procedure Details Consent: Risks of procedure as well as the alternatives and risks of each were explained to the (patient/caregiver).  Consent for procedure obtained. Time Out: Verified patient identification, verified procedure, site/side was marked, verified correct patient position, special equipment/implants available, medications/allergies/relevent history reviewed, required imaging and test results available.  Performed  Maximum sterile technique was used including antiseptics, cap, gloves, gown, hand hygiene, mask and sheet. Skin prep: Chlorhexidine; local anesthetic administered Triple lumen hemodialysis catheter was inserted into right internal jugular vein using the Seldinger technique.  Evaluation Blood flow good Complications: No apparent complications Patient did tolerate procedure well. Chest X-ray ordered to verify placement.  CXR: pending.  Real time 2D ultrasound used for vein site selection, patency assessment, and needle entry. A record of image was made and submitted for record filing / A record of image was made but could not be submitted for filing due to malfunction of printing device   SIGNATURE    Dr. Brand Males, M.D., F.C.C.P,  Pulmonary and Critical Care Medicine Staff Physician, [place] Director - Interstitial Lung Disease  Program  Pulmonary [place] at [place], [place], [address]  Pager: [phone], If no answer or between  15:00h - 7:00h: call [phone] Telephone: [phone]  3:32 PM 02/11/2019

## 2019-02-11 NOTE — Progress Notes (Signed)
Pt alarming R on T PVC's and tachycardia intermittently, but returns to sinus rhythm.  12 lead EKG recorded in chart as sinus rhythm.  Elink RN Kathlee Nations notified.  AM labs to be resulted.  Will continue to monitor.

## 2019-02-11 NOTE — Progress Notes (Signed)
Lab review   - no schistocytes on smear  - retic count - normal  - So, doubt TTP - [id number] pending  However  - rising K  - abg ok Recent Labs  Lab 02/09/19 0500 02/10/19 0613 02/10/19 1236 02/11/19 0445 02/11/19 1148  K 4.5 4.8 5.1 5.1 5.5*   Recent Labs  Lab 02/10/19 1236 02/11/19 1148  PHART 7.262* 7.418  PCO2ART 46.4 30.8*  PO2ART 110.0* 159.0*  HCO3 20.9 19.9*  TCO2 22 21*  O2SAT 97.0 99.0    Plan Increase lokelma to tid scheduled Start bic gtt Await resnal consult     SIGNATURE    Dr. Brand Males, M.D., F.C.C.P,  Pulmonary and Critical Care Medicine Staff Physician, [place] Director - Interstitial Lung Disease  Program  Pulmonary [place] at [place], [place], [address]  Pager: [phone], If no answer or between  15:00h - 7:00h: call [phone] Telephone: [phone]  1:14 PM 02/11/2019

## 2019-02-11 NOTE — Consult Note (Addendum)
Renal Service Consult Note [facility]  Lavina HammanDee Priest 02/11/2019 Maree Krabbeobert D Samarra Ridgely Requesting Physician:  Dr Marchelle Gearingamaswamy  Reason for Consult:  Renal failure acute HPI: The patient is a 58 y.o. year-old with hx of DM, obestiy, GERD presented to ED 02/13/2019 w/ several weeks hx of jaundice, N/V and abd pain w/ increasing falls/ AMS.  In ED found to have acute liver injury, hyponatremia, RLL infiltrate and acute / chronic SDH w/ mass effect.  Admitted to ICU. Recent new meds were sitagliptin and ibuprofen.  Hep A,B and C were negative, tbili 31 and direct was 19.  NSurg did hematoma evac surgery on 6/6. GI consulted and ordered serologies for acute liver injury, serologies were negative, supportive care. Extubated 6/11 and reintubated 6/13.  Gradual worsening of renal function since 6/7, BUN now 115 and Cr 4.25, asked to see for AKI.   Patient got a few doses of lasix IV mid hospitalization, no acei/ aRB.  She did get contrast IV w/ CT of chest / abd on 6/5 the day of admission and creat started to rise shortly thereafter.  Had a few low BP's but no sustained hypotension early on, BP's have been high here mostly. BP's dropped about 24 hrs ago into the 70's and she is now on levo gtt.  Pt has become obtunded also over the last 1-2 days.  NH3 is 100.     ROS - n/a   Past Medical History  Past Medical History:  Diagnosis Date  . Diabetes mellitus without complication [facility](HCC)    Past Surgical History  Past Surgical History:  Procedure Laterality Date  . CRANIOTOMY Left 02/02/2019   Procedure: CRANIOTOMY HEMATOMA EVACUATION SUBDURAL;  Surgeon: Tia AlertJones, David S, MD;  [facility];  Service: Neurosurgery;  Laterality: Left;   Family History History reviewed. No pertinent family history. Social History  has no history on file for tobacco, alcohol, and drug. Allergies No Known Allergies Home medications Prior to Admission medications   Medication Sig Start Date End Date Taking? Authorizing  Provider  fluticasone (FLONASE) 50 MCG/ACT nasal spray Place 2 sprays into the nose daily. 01/29/19 01/29/20 Yes [provider]  ibuprofen (ADVIL) 800 MG tablet Take 800 mg by mouth every 8 (eight) hours as needed for pain. 01/29/19  Yes [provider]  levothyroxine (SYNTHROID) 25 MCG tablet Take 25 mcg by mouth daily.   Yes [provider]  metFORMIN (GLUCOPHAGE) 500 MG tablet Take 500 mg by mouth daily. 06/09/18  Yes [provider]  omeprazole (PRILOSEC) 20 MG capsule Take 20 mg by mouth daily.   Yes [provider]  ondansetron (ZOFRAN-ODT) 8 MG disintegrating tablet Take 8 mg by mouth every 8 (eight) hours as needed for nausea. 02/01/19  Yes [provider]   Liver Function Tests Recent Labs  Lab 02/09/19 0500 02/10/19 0613 02/11/19 0445  AST 266* 279* 232*  ALT 51* 69* 67*  ALKPHOS 325* 399* 433*  BILITOT 34.3* 33.8* 34.2*  PROT 5.2* 4.9* 5.2*  ALBUMIN 1.8* 1.6* 1.7*   No results for input(s): LIPASE, AMYLASE in the last 168 hours. CBC Recent Labs  Lab 02/09/19 0500 02/10/19 0613 02/10/19 1236 02/11/19 0445 02/11/19 1148  WBC 30.4* 24.8*  --  20.4*  --   NEUTROABS 27.1* 20.7*  --  17.7*  --   HGB 8.9* 8.5* 13.9 8.5* 8.5*  HCT 26.3* 25.5* 41.0 25.4* 25.0*  MCV 96.7 98.8  --  98.8  --   PLT 127* 89*  --  82*  --    Basic Metabolic Panel Recent Labs  Lab 02/05/19 0503 02/06/19 0500 02/07/19 0448 02/08/19 0515 02/09/19 0500 02/10/19 8338 02/10/19 1206 02/10/19 1236 02/10/19 1741 02/11/19 0445 02/11/19 1148  NA 126* 130* 131* 142 135 137  --  139  --  135 139  K 3.6 3.8 4.1 3.8 4.5 4.8  --  5.1  --  5.1 5.5*  CL 94* 98 101 113* 104 105  --   --   --  104  --   CO2 19* 19* 17* 20* 18* 19*  --   --   --  17*  --   GLUCOSE 142* 158* 165* 145* 101* 171*  --   --   --  122*  --   BUN 28* 40* 50* 43* 88* 103*  --   --   --  115*  --   CREATININE 1.57* 1.87* 2.14* 1.30* 3.33* 3.96*  --   --   --  4.25*  --   CALCIUM  7.4* 7.5* 7.5* 7.9* 8.2* 8.1*  --   --   --  8.3*  --   PHOS  --  2.4* 3.7 3.1  --   --  3.7  --  4.2 4.2  --    Iron/TIBC/Ferritin/ %Sat No results found for: IRON, TIBC, FERRITIN, IRONPCTSAT  Vitals:   02/11/19 1000 02/11/19 1100 02/11/19 1147 02/11/19 1200  BP: (!) 129/51 115/61  (!) 135/52  Pulse: 84 92  84  Resp: (!) 26 (!) 22  (!) 27  Temp:    99.7 F (37.6 C)  TempSrc:    Axillary  SpO2: 100% 100% 100% 99%  Weight:      Height:        Exam Gen on vent, unreponsive, jaundiced, ETT in place No jvd or bruits Chest coarse BS bilat RRR no MRG Abd mod distended, dull to percussion, dec'd BS, prob ascites GU dark brown urine small amts in bag MS no joint effusions or deformity Ext diffuse 2+ UE and LE edema Neuro is intubated, unresponsive, not sedated    Home meds:  - ibuprofen 800 tid prn/ omeprazole 20 qd/ levothyroxine 25 ug  - metformin 500 qd  - prn's/ vitamins/ supplements   Peripheral smear > reviewed today, no sig schistocytes  Urine sediment > mild amount granular casts, some w/ bright orange-yellow staining, 5- 10 wbc/ hpf, no RBC's noted   6/14 today >> UNa 29  UCr 45     Assessment/ Plan: 1. Acute renal failure - in patient w/ acute liver injury unknown etiology. Severe jaundice.  Likely ATN due to hyperbilirubinemia +/- contrast. Less likely HRS, doubt TMA.  Gradual onset since 6/7. Making some urine.  Pt is now obtunded and BUN > 100 , would recommend RRT w/ CRRT.  Pull fluid if will tolerate, no anticoag to start.  2. Volume - up 8-10 kg by wts, diffuse edema, CVP 16- 21, pull fluid as tolerated 3. Anemia - Hb 8.5 4. Thrombocytopenia - haptoglobin pending; blood smear looks clean, no schistocytes noted, doubt this is MAHA/ TMA.  5. Acute liver injury 6. DM2      Kelly Splinter  MD 02/11/2019, 1:06 PM

## 2019-02-11 NOTE — Progress Notes (Addendum)
NAME:  Taylor Robinson, MRN:  [id number], DOB:  08/06/1961, LOS: 8 ADMISSION DATE:  01/31/2019, CONSULTATION DATE:  02/17/2019 REFERRING MD:  Taylor Robinson, CHIEF COMPLAINT:  Confusion   Brief History   58 y/o female admitted form home with jaundice, nausea and vomiting which developed over weeks, eventually developed confusion and found to have a subdural hemorrhage.  Required hematoma evacuation by neurosurgery on 6/6.    Past Medical History  Hypothyroidism, DM2, GERD, obestiy  Significant Hospital Events   6/5 - smear review - normocytic anemia 6/6 craniotomy - [facility] - decision to give vit k and [facility]  6/7  GI consult - Etiology of hyperbilirubinemia is unclear; multiple serologies are pending - "  Profound hyperbilirubinemia with much less elevated other liver function tests.  I am not sure this is liver failure, but assessment under current patient condition (intubation, minimally arousable) is difficult.  Relative normal INR argues at least against progressive liver failure.  No evidence of biliary obstruction".   6/10 - GI consult - supportive care. No role for steroids, Serologies negative. Liver bx risk felt to outweigh benefits - "It is felt this is most likely a drug reaction, superimposed likely on chronic hepatic steatosis, but certainly hemolysis from intracranial bleeding could lead to an enhanced degree of hyperbilirubinemia.". GI d/w [facility] Dr Taylor Robinson - not felt to be a transplant candidate because INR normal and not felt to be liver failure + recent craniotomy  6/11 extubated  6/13 - reintubated and central line - WBC elevated, INR OK, Bili higher today, Mental status worse. Ammonia level 107   Consults:  NSGY GI  Procedures:  6/6 ETT > 6/11, 6/13 >> 6/6 L Craniotomy for subdural hematoma Central line 6/13 >>  Significant Diagnostic Tests:  6/5 CTH >>Large subdural hematoma along the left frontoparietal convexity measuring 2.6 cm in width and causing a 6  mm rightward midline shift. 6/5 CT abd/pelvis + 6/5  CT chest: No acute intrathoracic pathology. Underdistention of the ascending colon versus less likely mild Colitis. Severe fatty infiltration of the liver. 6/6 RUQ ultrasound > steatosis, gludge CT head 6/7  reveals improvement in shift after evacuation, new focus of bleeding posterior temporal.  6/12 RUQ ultrasound > sludge in gallbladder lumen, hepatic steatosis  Micro Data:  6/5 SARS coronavirus 2 >> neg 6/5 BCx 2 >> neg 6/5 UC >> insignificant growth  Antimicrobials:  6/6 cefazolin x1  Interim history/subjective:   6/14 - remain intubated on vent. On levophed. CVP 18. Creat up at 4.25 (admit 1.26). K 5.1, bic 17. Plat dropped to 82. INR 1.2 bili 34 and worse (per RN - bed bound for few months prior to admission - per daughter related to concussion). On daily albumin. Goal BP - MAP > 80 . Makes 15-20cc/h urine    Objective   Blood pressure (!) 137/47, pulse 84, temperature 99.8 F (37.7 C), temperature source Axillary, resp. rate (!) 26, height 5\' 6"  (1.676 m), weight 104.9 kg, SpO2 100 %. CVP:  [15 mmHg-21 mmHg] 19 mmHg  Vent Mode: PRVC FiO2 (%):  [40 %] 40 % Set Rate:  [18 bmp] 18 bmp Vt Set:  [470 mL] 470 mL PEEP:  [5 cmH20] 5 cmH20 Plateau Pressure:  [9 cmH20-20 cmH20] 9 cmH20   Intake/Output Summary (Last 24 hours) at 02/11/2019 0914 Last data filed at 02/11/2019 0900 Gross per 24 hour  Intake 2647.5 ml  Output 385 ml  Net 2262.5 ml  Filed Weights   02/09/19 0500 02/10/19 0500 02/11/19 0431  Weight: 106 kg 106 kg 104.9 kg   General Appearance:  Looks criticall ill OBESE - +. SHORT +. DIFFUSE JAUNDICE + Head:  Normocephalic, without obvious abnormality, atraumatic Eyes:  PERRL - ues, conjunctiva/corneas - JAUNDICE     Ears:  Normal external ear canals, both ears Nose:  G tube - no Throat:  ETT TUBE - yes , OG tube - yes,  Neck:  Supple,  No enlargement/tenderness/nodules. LEft IJ CVL + Lungs: Clear to  auscultation bilaterally, Ventilator   Synchrony - yes Heart:  S1 and S2 normal, no murmur, CVP - 18.  Pressors - levophed + Abdomen:  Soft, no masses, no organomegaly Genitalia / Rectal:  Not done Extremities:  Extremities- intact Skin:  ntact in exposed areas . Sacral area - not examined Neurologic:  Sedation - x -> RASS - -4 . Moves all 4s - no. CAM-ICU - cannot test . Orientation - cannot test     LABS    PULMONARY Recent Labs  Lab 02/10/19 1236  PHART 7.262*  PCO2ART 46.4  PO2ART 110.0*  HCO3 20.9  TCO2 22  O2SAT 97.0    CBC Recent Labs  Lab 02/09/19 0500 02/10/19 0613 02/10/19 1236 02/11/19 0445  HGB 8.9* 8.5* 13.9 8.5*  HCT 26.3* 25.5* 41.0 25.4*  WBC 30.4* 24.8*  --  20.4*  PLT 127* 89*  --  82*    COAGULATION Recent Labs  Lab 02/05/19 0503 02/06/19 0500 02/07/19 0448 02/10/19 0613 02/11/19 0445  INR 1.2 1.3* 1.2 1.3* 1.2    CARDIAC  No results for input(s): TROPONINI in the last 168 hours. No results for input(s): PROBNP in the last 168 hours.   CHEMISTRY Recent Labs  Lab 02/07/19 0448 02/08/19 0515 02/09/19 0500 02/10/19 0613 02/10/19 1206 02/10/19 1236 02/10/19 1741 02/11/19 0445  NA 131* 142 135 137  --  139  --  135  K 4.1 3.8 4.5 4.8  --  5.1  --  5.1  CL 101 113* 104 105  --   --   --  104  CO2 17* 20* 18* 19*  --   --   --  17*  GLUCOSE 165* 145* 101* 171*  --   --   --  122*  BUN 50* 43* 88* 103*  --   --   --  115*  CREATININE 2.14* 1.30* 3.33* 3.96*  --   --   --  4.25*  CALCIUM 7.5* 7.9* 8.2* 8.1*  --   --   --  8.3*  MG 2.3 2.5*  --   --  2.2  --  2.3 2.3  PHOS 3.7 3.1  --   --  3.7  --  4.2 4.2   Estimated Creatinine Clearance: 18.1 mL/min (A) (by C-G formula based on SCr of 4.25 mg/dL (H)).   LIVER Recent Labs  Lab 02/05/19 0503 02/06/19 0500 02/07/19 0448 02/08/19 0515 02/09/19 0500 02/10/19 0613 02/11/19 0445  AST 142* 153* 190* 100* 266* 279* 232*  ALT 23 26 31  88* 51* 69* 67*  ALKPHOS 219* 206* 239*  148* 325* 399* 433*  BILITOT 27.7* 29.2* 25.5* 3.9* 34.3* 33.8* 34.2*  PROT 5.0* 5.1* 5.5* 5.7* 5.2* 4.9* 5.2*  ALBUMIN 1.7* 2.1* 2.2* 1.7* 1.8* 1.6* 1.7*  INR 1.2 1.3* 1.2  --   --  1.3* 1.2     INFECTIOUS No results for input(s): LATICACIDVEN, PROCALCITON in the last 168 hours.  ENDOCRINE CBG (last 3)  Recent Labs    02/10/19 2321 02/11/19 0314 02/11/19 0753  GLUCAP 103* 113* 120*         IMAGING x48h  - image(s) personally visualized  -   highlighted in bold Ct Head Wo Contrast  Result Date: 02/10/2019 CLINICAL DATA:  Follow-up examination for subdural hemorrhage. EXAM: CT HEAD WITHOUT CONTRAST TECHNIQUE: Contiguous axial images were obtained from the base of the skull through the vertex without intravenous contrast. COMPARISON:  Prior CT from 02/04/2019. FINDINGS: Brain: Postoperative changes from left-sided craniotomy for subdural evacuation again seen. Residual left extra-axial hematoma overlying the subjacent left cerebral convexity measures up to 8 mm in maximal thickness, improved from previous. Small amount of subdural blood seen along the posterior falx. Small amount of residual extra-axial pneumocephalus, markedly decreased from prior. Underlying intraparenchymal hematoma centered at the left temporal lobe has mildly dispersed as compared to previous exam, now measuring slightly larger in size at 3.5 x 4.2 x 2.1 cm (previously 3.1 x 4.2 x 2.2 cm when measured at similar dimensions. Surrounding low-density vasogenic edema has slightly increased. Adjacent small volume subarachnoid hemorrhage, similar. Persistent 4 mm left-to-right shift, unchanged. Overall ventricular size and morphology relatively unchanged without hydrocephalus or progressive ventricular trapping. Basilar cisterns remain patent. Evolving cytotoxic edema seen involving the parasagittal left occipital lobe (series 2, image 13), compatible with evolving left PCA territory infarct, more prominent as compared to  previous exam. No associated hemorrhage or mass effect. No other new acute intracranial hemorrhage. No other acute large vessel territory infarct. No mass lesion. Vascular: No hyperdense vessel. Skull: Postoperative changes from prior left craniotomy. No adverse features. Sinuses/Orbits: Globes and orbital soft tissues demonstrate no acute finding. Paranasal sinuses are largely clear. Chronic left orbital floor fracture noted. Nasogastric tube in place. Trace bilateral mastoid effusions. Other: None. IMPRESSION: 1. Slight interval dispersion of left temporal lobe intraparenchymal hematoma, now measuring slightly increased in size at 3.5 x 4.2 x 2.1 cm (previously 3.1 x 4.2 x 2.2 cm). Surrounding vasogenic edema has slightly increased. Associated 4 mm left-to-right midline shift unchanged. 2. Interval decrease in size of left extra-axial hemorrhage status post left craniotomy for subdural evacuation, now measuring up to 8 mm in maximal thickness. 3. Evolving acute left PCA territory infarct. No associated hemorrhage or mass effect. 4. No other new acute intracranial process. Electronically Signed   By: Rise MuBenjamin  McClintock M.D.   On: 02/10/2019 04:24   Dg Chest Port 1 View  Result Date: 02/10/2019 CLINICAL DATA:  58 year old female EXAM: PORTABLE CHEST 1 VIEW COMPARISON:  02/06/2019 FINDINGS: Left rotation somewhat limits evaluation. Low lung volumes with patchy opacities at the bilateral lung bases. Blunting of the left costophrenic angle. No pneumothorax.  Mild interlobular septal thickening. Endotracheal tube terminates at the carina, near the right mainstem bronchus. Withdrawal of 4-5 cm may position this better at the clavicular heads. Interval placement of left IJ central venous catheter. The tip projects over the aortic arch on this plain film, though there is left rotation of the patient. No pneumothorax. Feeding tube projects over the mediastinum and terminates in the abdomen out of the field of view.  IMPRESSION: Endotracheal tube terminates at the carina directed towards the right mainstem. Withdrawal of 4-5 cm may position this more optimally. Left IJ central venous catheter has been placed with no pneumothorax. The tip cannot be located on this plain film. Enteric tube has been placed projecting over the mediastinum and terminating out of the field of view  in the abdomen. These above results were discussed by telephone at the time of interpretation on 02/10/2019 at 2:12 pm with the nurse caring for the patient Ms Tammy. Low lung volumes with possible combination of atelectasis/consolidation, edema, and possible left pleural effusion. Electronically Signed   By: Gilmer Mor D.O.   On: 02/10/2019 14:13   US Abdomen Limited Ruq  Result Date: 02/09/2019 CLINICAL DATA:  Abnormal transaminase level. EXAM: ULTRASOUND ABDOMEN LIMITED RIGHT UPPER QUADRANT COMPARISON:  Ultrasound of 02/17/2019. FINDINGS: Gallbladder: No gallstones or wall thickening visualized. No sonographic Murphy sign noted by sonographer. Sludge is noted within gallbladder lumen. Common bile duct: Diameter: 3 mm which is within normal limits. Liver: No focal lesion identified. Increased echogenicity of hepatic parenchyma is noted. Portal vein is patent on color Doppler imaging with normal direction of blood flow towards the liver. IMPRESSION: Sludge noted within gallbladder lumen.  Hepatic steatosis. Electronically Signed   By: Lupita Raider M.D.   On: 02/09/2019 09:49     Resolved Hospital Problem list     Assessment & Plan:  ASSESSMENT / PLAN:  PULMONARY A:  Acute resp failure at admit 02/03/2019 due to Subdural but reintubated 6/13 due to encephalopathy  02/11/2019 -> does not meet sbt criteria  P:   Full vent support   NEUROLOGIC A:   Acute encepahlopathy - liver and SDH   02/11/2019 - unresponsive without sedation. Rising ammonia  P:   Prn sedation Start lactulose dail NSGY   VASCULAR A:   Circulatory  shock   P:  Levophed for MAP > 80 to help renal perfusion  CARDIAC A: At risk MI  P: Check trop   INFECTIOUS A:   No evidence of infection P:   Check PCT  RENAL A:  PRogressive renal failure since admit - new (admit creat 1.26)   6/14 - incipient acidosis and hyperkalemia and dropping ur op  P:  Renal consult Daily albumin  ELECTROLYTES A:  Incipient hyperkalemia  P: lokelma   GASTROINTESTINAL A:    Jaundice since admit - etiology not known . Seen by GI  No etiology. GI following prn  6/14 - worsening jaundice but normal INR  P:   monitor ?  consider transjugular hepatic needle biopsy F/u CMV Check TSH   HEMATOLOGIC A:  Anemia Thromboctyopenia  6/14 -worse. Concern for TTP   P:  - peripheral smear for schistocytes  - check MCV and retic count - check ADAMTS --13 activity  - PRBC for hgb </= 6.9gm%    - exceptions are   -  if ACS susepcted/confirmed then transfuse for hgb </= 8.0gm%,  or    -  active bleeding with hemodynamic instability, then transfuse regardless of hemoglobin value   At at all times try to transfuse 1 unit prbc as possible with exception of active hemorrhage     ENDOCRINE A:   At risk hyper/hypoglycemia   P:   ssi  MSK/DERM Groin and inframmamary - moisture related and painful STage 2 sacral decub v moisture related -   - both pre-admit  Plan  - dressing     Best practice:  Diet: start tube feeding after intubation Pain/Anxiety/Delirium protocol (if indicated): yes, prn fentanyl, versed, RASS target 0 to -1 VAP protocol (if indicated): yes DVT prophylaxis: SCD GI prophylaxis: PPI Glucose control: SSI Mobility: bed rest Code Status: full  Family Communication:   6/6 - PCCM - called and spoke with the patient's daughter Trinna Post, telephone [phone]-  [phone].  She also has a younger brother who is 98 years old.  They are currently making medical decisions for the patient.  Upon further discussion with her she  does state that her mother is married.  They are separated but not legally divorced.  Her husband is at [employer] who currently lives in [place].  And the daughter has attempted to reach out to him this morning but there has been no answer.  His telephone number is [phone].  I have attempted to call the number provided by his daughter to reach [place] and there was no answer.  6/7 - husband updated  6/13  - updated her husband Louie Casa on 6/13, they are separated.  He really doesn't have much clue about her prognosis.  I let him know that I think it is terrible and she is not a liver transplant candidate.  He voiced understanding.  He says her adult children are not really in a place to make decisions about her condition.  He recommended we proceed with intubaiton and supportive care and he would start talking to her kids about goals of care.  6/14 -  PCCM MD called Daughter Sharlynn Oliphant [phone]) = aware patient back on vent. Reports she has a brother who is 25 and apparently "knows everything". Daughter says she prefers first call is to the separated husband / step dad and he updates kids but ok to call her as well esp if we cannot get hold of him.  Updated - daughter is agreeable to DNR if arrest but still full medical care but wants to talk to step dad/separated husband. For now see outcome of TTP workup and renal consult . Later husband called and confirmed to RN NO CPR but full medical care. Cal husband first  Disposition: remain in [place]   The patient Windy Dudek is critically ill with multiple organ systems failure and requires high complexity decision making for assessment and support, frequent evaluation and titration of therapies, application of advanced monitoring technologies and extensive interpretation of multiple databases.   Critical Care Time devoted to patient care services described in this note is  60  Minutes. This time reflects time of care of  this signee Dr Brand Males. This critical care time does not reflect procedure time, or teaching time or supervisory time of PA/NP/Med student/Med Resident etc but could involve care discussion time     Dr. Brand Males, M.D., [facility].C.P Pulmonary and Critical Care Medicine Staff Physician [place] [facility] Pager: [phone], If no answer or between  15:00h - 7:00h: call [phone]  02/11/2019 9:14 AM

## 2019-02-11 NOTE — Progress Notes (Signed)
  NEUROSURGERY PROGRESS NOTE   Pt required intubation yesterday.  EXAM:  BP (!) 129/51   Pulse 84   Temp 99.8 F (37.7 C) (Axillary)   Resp (!) 26   Ht 5\' 6"  (1.676 m)   Wt 104.9 kg   SpO2 100%   BMI 37.33 kg/m   Opens eyes to stimuli Breathing over vent W/d BUE/BLE Wound c/d/i  IMPRESSION:  58 y.o. female POD#8 s/p left crani for SDH. Appears to be neurologically stable but obtunded. Suspect her overall prognosis is poor.  PLAN: - Cont supportive care per PCCM

## 2019-02-11 NOTE — Progress Notes (Signed)
PT Cancellation Note  Patient Details Name: Taylor Robinson MRN: [id number] DOB: 19-Aug-1961   Cancelled Treatment:    Reason Eval/Treat Not Completed: Active bedrest order   Duncan Dull 02/11/2019, 9:18 AM

## 2019-02-11 NOTE — Progress Notes (Signed)
   Reviewed gi notes - features  are not c/w liver failure  D.w renal -  ? hyperbiliribuinemia cast +/- contrast ATN. Features not consistent with hepato renal snydrome  Plan (d/w renal and husband)   - HD cath and then CRRT; time limited trial of few to several days (risks of HD cath explained)  - No CPR  - he does not want her to be a "vegetable" or in pain - . Suspect this would mean if she is unimproved with CRRT then no ltact, no trach, and she would be terminal wean. Will have to address this over the next few days      SIGNATURE    Dr. Brand Males, M.D., F.C.C.P,  Pulmonary and Critical Care Medicine Staff Physician, [place] Director - Interstitial Lung Disease  Program  [facility] [place], [place], [address]  Pager: [phone], If no answer or between  15:00h - 7:00h: call [phone] Telephone: [phone]  2:32 PM 02/11/2019

## 2019-02-12 ENCOUNTER — Inpatient Hospital Stay (HOSPITAL_COMMUNITY): Payer: BC Managed Care – PPO

## 2019-02-12 DIAGNOSIS — R40244 Other coma, without documented Glasgow coma scale score, or with partial score reported, unspecified time: Secondary | ICD-10-CM

## 2019-02-12 LAB — CBC WITH DIFFERENTIAL/PLATELET
Abs Immature Granulocytes: 0.7 10*3/uL — ABNORMAL HIGH (ref 0.00–0.07)
Basophils Absolute: 0 10*3/uL (ref 0.0–0.1)
Basophils Relative: 0 %
Eosinophils Absolute: 0.1 10*3/uL (ref 0.0–0.5)
Eosinophils Relative: 1 %
HCT: 24 % — ABNORMAL LOW (ref 36.0–46.0)
Hemoglobin: 7.9 g/dL — ABNORMAL LOW (ref 12.0–15.0)
Immature Granulocytes: 5 %
Lymphocytes Relative: 6 %
Lymphs Abs: 0.8 10*3/uL (ref 0.7–4.0)
MCH: 32.9 pg (ref 26.0–34.0)
MCHC: 32.9 g/dL (ref 30.0–36.0)
MCV: 100 fL (ref 80.0–100.0)
Monocytes Absolute: 0.4 10*3/uL (ref 0.1–1.0)
Monocytes Relative: 3 %
Neutro Abs: 11.9 10*3/uL — ABNORMAL HIGH (ref 1.7–7.7)
Neutrophils Relative %: 85 %
Platelets: 61 10*3/uL — ABNORMAL LOW (ref 150–400)
RBC: 2.4 MIL/uL — ABNORMAL LOW (ref 3.87–5.11)
RDW: 18.2 % — ABNORMAL HIGH (ref 11.5–15.5)
WBC: 14 10*3/uL — ABNORMAL HIGH (ref 4.0–10.5)
nRBC: 0.8 % — ABNORMAL HIGH (ref 0.0–0.2)

## 2019-02-12 LAB — RENAL FUNCTION PANEL
Albumin: 2 g/dL — ABNORMAL LOW (ref 3.5–5.0)
Albumin: 1.8 g/dL — ABNORMAL LOW (ref 3.5–5.0)
Anion gap: 13 (ref 5–15)
Anion gap: 12 (ref 5–15)
BUN: 80 mg/dL — ABNORMAL HIGH (ref 6–20)
BUN: 50 mg/dL — ABNORMAL HIGH (ref 6–20)
CO2: 21 mmol/L — ABNORMAL LOW (ref 22–32)
CO2: 23 mmol/L (ref 22–32)
Calcium: 8.4 mg/dL — ABNORMAL LOW (ref 8.9–10.3)
Calcium: 8.5 mg/dL — ABNORMAL LOW (ref 8.9–10.3)
Chloride: 103 mmol/L (ref 98–111)
Chloride: 101 mmol/L (ref 98–111)
Creatinine, Ser: 2.63 mg/dL — ABNORMAL HIGH (ref 0.44–1.00)
Creatinine, Ser: 1.67 mg/dL — ABNORMAL HIGH (ref 0.44–1.00)
GFR calc Af Amer: 23 mL/min — ABNORMAL LOW (ref >60)
GFR calc Af Amer: 39 mL/min — ABNORMAL LOW (ref >60)
GFR calc non Af Amer: 20 mL/min — ABNORMAL LOW (ref >60)
GFR calc non Af Amer: 34 mL/min — ABNORMAL LOW (ref >60)
Glucose, Bld: 136 mg/dL — ABNORMAL HIGH (ref 70–99)
Glucose, Bld: 105 mg/dL — ABNORMAL HIGH (ref 70–99)
Phosphorus: 4.4 mg/dL (ref 2.5–4.6)
Phosphorus: 3.2 mg/dL (ref 2.5–4.6)
Potassium: 4.3 mmol/L (ref 3.5–5.1)
Potassium: 4.2 mmol/L (ref 3.5–5.1)
Sodium: 137 mmol/L (ref 135–145)
Sodium: 136 mmol/L (ref 135–145)

## 2019-02-12 LAB — HEPATIC FUNCTION PANEL
ALT: 74 U/L — ABNORMAL HIGH (ref 0–44)
AST: 246 U/L — ABNORMAL HIGH (ref 15–41)
Albumin: 2 g/dL — ABNORMAL LOW (ref 3.5–5.0)
Alkaline Phosphatase: 402 U/L — ABNORMAL HIGH (ref 38–126)
Bilirubin, Direct: 25 mg/dL — ABNORMAL HIGH (ref 0.0–0.2)
Indirect Bilirubin: 9.4 mg/dL — ABNORMAL HIGH (ref 0.3–0.9)
Total Bilirubin: 34.4 mg/dL (ref 0.3–1.2)
Total Protein: 5.5 g/dL — ABNORMAL LOW (ref 6.5–8.1)

## 2019-02-12 LAB — APTT: aPTT: 38 seconds — ABNORMAL HIGH (ref 24–36)

## 2019-02-12 LAB — PROTIME-INR
INR: 1.2 (ref 0.8–1.2)
Prothrombin Time: 14.9 seconds (ref 11.4–15.2)

## 2019-02-12 LAB — GLUCOSE, CAPILLARY
Glucose-Capillary: 148 mg/dL — ABNORMAL HIGH (ref 70–99)
Glucose-Capillary: 110 mg/dL — ABNORMAL HIGH (ref 70–99)
Glucose-Capillary: 99 mg/dL (ref 70–99)
Glucose-Capillary: 97 mg/dL (ref 70–99)
Glucose-Capillary: 109 mg/dL — ABNORMAL HIGH (ref 70–99)

## 2019-02-12 LAB — PROCALCITONIN: Procalcitonin: 4.52 ng/mL

## 2019-02-12 LAB — LACTIC ACID, PLASMA: Lactic Acid, Venous: 1.6 mmol/L (ref 0.5–1.9)

## 2019-02-12 LAB — MAGNESIUM: Magnesium: 2.4 mg/dL (ref 1.7–2.4)

## 2019-02-12 NOTE — Progress Notes (Signed)
Subjective: Interval History: has no complaint on vent,poorly responsive.  Objective: Vital signs in last 24 hours: Temp:  [96.2 F (35.7 C)-99.8 F (37.7 C)] 97.6 F (36.4 C) (06/15 0800) Pulse Rate:  [30-96] 71 (06/15 0900) Resp:  [18-31] 25 (06/15 0900) BP: (111-147)/(46-110) 129/59 (06/15 0900) SpO2:  [94 %-100 %] 100 % (06/15 0900) Arterial Line BP: (129-181)/(48-72) 142/48 (06/15 0900) FiO2 (%):  [30 %] 30 % (06/15 0738) Weight:  [108.6 kg] 108.6 kg (06/15 0400) Weight change: 3.7 kg  Intake/Output from previous day: 06/14 0701 - 06/15 0700 In: 2805.8 [I.V.:1563.3; NG/GT:1232.5] Out: 3388 [Urine:312] Intake/Output this shift: Total I/O In: 218.3 [I.V.:118.3; NG/GT:100] Out: 371 [Urine:10; Other:361]  General appearance: morbidly obese and jaundiced, on vent, not coop Resp: rhonchi bilaterally Cardio: S1, S2 normal and systolic murmur: holosystolic 2/6, blowing at apex GI: obese, pos bs, soft Extremities: edema 2+  Lab Results: Recent Labs    02/11/19 0445 02/11/19 1148 02/12/19 0500  WBC 20.4*  --  14.0*  HGB 8.5* 8.5* 7.9*  HCT 25.4* 25.0* 24.0*  PLT 82*  --  61*   BMET:  Recent Labs    02/11/19 0445 02/11/19 1148 02/12/19 0500  NA 135 139 137  K 5.1 5.5* 4.3  CL 104  --  103  CO2 17*  --  21*  GLUCOSE 122*  --  136*  BUN 115*  --  80*  CREATININE 4.25*  --  2.63*  CALCIUM 8.3*  --  8.4*   No results for input(s): PTH in the last 72 hours. Iron Studies: No results for input(s): IRON, TIBC, TRANSFERRIN, FERRITIN in the last 72 hours.  Studies/Results: Dg Chest Port 1 View  Result Date: 02/12/2019 CLINICAL DATA:  Intubation. EXAM: PORTABLE CHEST 1 VIEW COMPARISON:  02/11/2019.  02/04/2019. FINDINGS: Interim extubation. Feeding tube and bilateral IJ lines in stable position. Stable cardiomegaly. Bilateral pulmonary interstitial prominence, progressed from prior exam. A component of CHF could present this fashion. Bibasilar atelectasis/infiltrates  again noted. No pneumothorax. IMPRESSION: 1. Interim extubation. Feeding tube and bilateral IJ lines in stable position. 2. Stable cardiomegaly. Bilateral from interstitial prominence, progressed from prior exam. A component of CHF could present this fashion. 3.  Bibasilar atelectasis/infiltrates again Electronically Signed   By: Maisie Fushomas  Register   On: 02/12/2019 08:04   Dg Chest Port 1 View  Result Date: 02/11/2019 CLINICAL DATA:  Central line clotted EXAM: PORTABLE CHEST 1 VIEW COMPARISON:  Portable exam 1556 hours compared to 02/10/2019 FINDINGS: Tip of endotracheal tube projects 6.8 cm above carina. Feeding tube extends into stomach. New RIGHT jugular central venous catheter with tip projecting over SVC. LEFT jugular line unchanged. Enlargement of cardiac silhouette. Mediastinal contours normal. Low lung volumes with bibasilar atelectasis. No acute infiltrate, pleural effusion or pneumothorax. IMPRESSION: No pneumothorax following RIGHT jugular line placement. Enlargement of cardiac silhouette with bibasilar atelectasis. Electronically Signed   By: Ulyses SouthwardMark  Boles M.D.   On: 02/11/2019 16:35   Dg Chest Port 1 View  Result Date: 02/10/2019 CLINICAL DATA:  58 year old female EXAM: PORTABLE CHEST 1 VIEW COMPARISON:  02/06/2019 FINDINGS: Left rotation somewhat limits evaluation. Low lung volumes with patchy opacities at the bilateral lung bases. Blunting of the left costophrenic angle. No pneumothorax.  Mild interlobular septal thickening. Endotracheal tube terminates at the carina, near the right mainstem bronchus. Withdrawal of 4-5 cm may position this better at the clavicular heads. Interval placement of left IJ central venous catheter. The tip projects over the aortic arch on this plain  film, though there is left rotation of the patient. No pneumothorax. Feeding tube projects over the mediastinum and terminates in the abdomen out of the field of view. IMPRESSION: Endotracheal tube terminates at the carina  directed towards the right mainstem. Withdrawal of 4-5 cm may position this more optimally. Left IJ central venous catheter has been placed with no pneumothorax. The tip cannot be located on this plain film. Enteric tube has been placed projecting over the mediastinum and terminating out of the field of view in the abdomen. These above results were discussed by telephone at the time of interpretation on 02/10/2019 at 2:12 pm with the nurse caring for the patient Ms Tammy. Low lung volumes with possible combination of atelectasis/consolidation, edema, and possible left pleural effusion. Electronically Signed   By: Corrie Mckusick D.O.   On: 02/10/2019 14:13    I have reviewed the patient's current medications.  Assessment/Plan: 1 AKI ?? Contrast, NSAID, low bps.  Acidemia improving , better solute/K with CRRT.  With pyuria ??allergic rx to meds. Will culture 2 Acute hepatic failure ??etio 3 Obesity 4 SDH  5 Resp failure P Urine culture, eos, ^CRRt, stop iv bicarb, lokelma not needed.   LOS: 9 days   Jeneen Rinks Avamae Dehaan 02/12/2019,9:26 AM

## 2019-02-12 NOTE — Progress Notes (Signed)
OT Cancellation Note  Patient Details Name: Taylor Robinson MRN: [id number] DOB: 1961-02-23   Cancelled Treatment:    Reason Eval/Treat Not Completed: Medical issues which prohibited therapy;Active bedrest order. Pt now on CRRT and intubated. Pt with poor prognosis. Sign off. Please re-consult if/when appropriate for therapy. Thank you.  [place], OTR/L Acute Rehab Pager: [phone] Office: [phone] 02/12/2019, 8:15 AM

## 2019-02-12 NOTE — Progress Notes (Signed)
PT Cancellation Note  Patient Details Name: Taylor Robinson MRN: [id number] DOB: 20-Jun-1961   Cancelled Treatment:    Reason Eval/Treat Not Completed: Patient not medically ready. Pt now on CRRT and intubated. Pt with poor prognosis. PT order cancelled. Please re-consult if/when appropriate to advance patients mobility.  Kittie Plater, PT, DPT Acute Rehabilitation Services Pager #: [phone] Office #: [phone]    Berline Lopes 02/12/2019, 7:52 AM

## 2019-02-12 NOTE — Progress Notes (Signed)
NAME:  Taylor HammanDee Robinson, MRN:  [id number], DOB:  02/10/1961, LOS: 9 ADMISSION DATE:  02/19/2019, CONSULTATION DATE:  02/27/2019 REFERRING MD:  Taylor Robinson, CHIEF COMPLAINT:  Confusion   Brief History   58 y/o female admitted form home with jaundice, nausea and vomiting which developed over weeks, eventually developed confusion and found to have a subdural hemorrhage.  Required hematoma evacuation by neurosurgery on 6/6.    Past Medical History  Hypothyroidism, DM2, GERD, obestiy  Significant Hospital Events   6/5 - smear review - normocytic anemia 6/6 craniotomy - CCM called [facility] for tx - decision to give vit k and Rx patient [facility]  6/7  GI consult - Etiology of hyperbilirubinemia is unclear; multiple serologies are pending - "  Profound hyperbilirubinemia with much less elevated other liver function tests.  I am not sure this is liver failure, but assessment under current patient condition (intubation, minimally arousable) is difficult.  Relative normal INR argues at least against progressive liver failure.  No evidence of biliary obstruction".   6/10 - GI consult - supportive care. No role for steroids, Serologies negative. Liver bx risk felt to outweigh benefits - "It is felt this is most likely a drug reaction, superimposed likely on chronic hepatic steatosis, but certainly hemolysis from intracranial bleeding could lead to an enhanced degree of hyperbilirubinemia.". GI d/w Taylor Robinson Dr Taylor Robinson - not felt to be a transplant candidate because INR normal and not felt to be liver failure + recent craniotomy  6/11 extubated  6/13 - reintubated and central line - WBC elevated, INR OK, Bili higher today, Mental status worse. Ammonia level 107   6/14 - remain intubated on vent. On levophed. CVP 18. Creat up at 4.25 (admit 1.26). K 5.1, bic 17. Plat dropped to 82. INR 1.2 bili 34 and worse (per RN - bed bound for few months prior to admission - per daughter related to concussion). On daily albumin. Goal BP - MAP > 80  . Makes 15-20cc/h urine   Consults:  NSGY GI  Procedures:  6/6 ETT > 6/11, 6/13 >> 6/6 L Craniotomy for subdural hematoma CVC 6/13 >> HD cath 6/14 >>   Significant Diagnostic Tests:  6/5 CTH >>Large subdural hematoma along the left frontoparietal convexity measuring 2.6 cm in width and causing a 6 mm rightward midline shift. 6/5 CT abd/pelvis + 6/5  CT chest: No acute intrathoracic pathology. Underdistention of the ascending colon versus less likely mild Colitis. Severe fatty infiltration of the liver. 6/6 RUQ ultrasound > steatosis, gludge CT head 6/7  reveals improvement in shift after evacuation, new focus of bleeding posterior temporal.  6/12 RUQ ultrasound > sludge in gallbladder lumen, hepatic steatosis  Micro Data:  6/5 SARS coronavirus 2 >> negative 6/5 BCx 2 >>  negative 6/6 BCx >> negative 6/5 UC >> insignificant growth 6/15 UC >>   Antimicrobials:  6/6 cefazolin x1  Interim history/subjective:  CVVHD initiated 6/14, requiring norepi 5 to tolerate.  No sedation, required fentanyl x1 overnight Bicarbonate, Lokelma stopped this morning Having loose mucoid stools    Objective   Blood pressure 118/82, pulse 81, temperature 97.6 F (36.4 C), temperature source Axillary, resp. rate (!) 28, height 5\' 6"  (1.676 m), weight 108.6 kg, SpO2 100 %. CVP:  [10 mmHg-26 mmHg] 15 mmHg  Vent Mode: PSV;CPAP FiO2 (%):  [30 %] 30 % Set Rate:  [18 bmp] 18 bmp Vt Set:  [470 mL] 470 mL PEEP:  [5 cmH20] 5 cmH20 Pressure Support:  [12 cmH20]  [place] Pressure:  [10 cmH20-21 cmH20] 21 cmH20   Intake/Output Summary (Last 24 hours) at 02/12/2019 1125 Last data filed at 02/12/2019 1100 Gross per 24 hour  Intake 3082.68 ml  Output 4067 ml  Net -984.32 ml   Filed Weights   02/10/19 0500 02/11/19 0431 02/12/19 0400  Weight: 106 kg 104.9 kg 108.6 kg   General Appearance: Chronically ill-appearing woman, ventilated Head: Normocephalic, left temporal staples in place, clean  and dry Eyes: Pupils equal, severe scleral icterus Throat: ET tube and OG tube in place, no significant secretions Neck: No lymphadenopathy, left IJ CVC, right IJ HD catheter Lungs: Coarse bilaterally, few scattered crackles, no wheezes Heart: Regular, no murmur, norepi 5 Abdomen: Soft, obese, nondistended, positive bowel sounds Extremities: Trace pretibial edema Skin: Severe jaundice, no rash Neurologic: Did not interact to pain, stimulation.  Did not open eyes or follow commands.  Not spontaneously moving    LABS    PULMONARY Recent Labs  Lab 02/10/19 1236 02/11/19 1148  PHART 7.262* 7.418  PCO2ART 46.4 30.8*  PO2ART 110.0* 159.0*  HCO3 20.9 19.9*  TCO2 22 21*  O2SAT 97.0 99.0    CBC Recent Labs  Lab 02/10/19 0613  02/11/19 0445 02/11/19 1148 02/12/19 0500  HGB 8.5*   < > 8.5* 8.5* 7.9*  HCT 25.5*   < > 25.4* 25.0* 24.0*  WBC 24.8*  --  20.4*  --  14.0*  PLT 89*  --  82*  --  61*   < > = values in this interval not displayed.    COAGULATION Recent Labs  Lab 02/06/19 0500 02/07/19 0448 02/10/19 0613 02/11/19 0445 02/12/19 0500  INR 1.3* 1.2 1.3* 1.2 1.2    CARDIAC  No results for input(s): TROPONINI in the last 168 hours. No results for input(s): PROBNP in the last 168 hours.   CHEMISTRY Recent Labs  Lab 02/08/19 0515 02/09/19 0500 02/10/19 0613 02/10/19 1206 02/10/19 1236 02/10/19 1741 02/11/19 0445 02/11/19 1148 02/12/19 0500  NA 142 135 137  --  139  --  135 139 137  K 3.8 4.5 4.8  --  5.1  --  5.1 5.5* 4.3  CL 113* 104 105  --   --   --  104  --  103  CO2 20* 18* 19*  --   --   --  17*  --  21*  GLUCOSE 145* 101* 171*  --   --   --  122*  --  136*  BUN 43* 88* 103*  --   --   --  115*  --  80*  CREATININE 1.30* 3.33* 3.96*  --   --   --  4.25*  --  2.63*  CALCIUM 7.9* 8.2* 8.1*  --   --   --  8.3*  --  8.4*  MG 2.5*  --   --  2.2  --  2.3 2.3  --  2.4  PHOS 3.1  --   --  3.7  --  4.2 4.2  --  4.4   Estimated Creatinine Clearance:  29.8 mL/min (A) (by C-G formula based on SCr of 2.63 mg/dL (H)).   LIVER Recent Labs  Lab 02/06/19 0500 02/07/19 0448 02/08/19 0515 02/09/19 0500 02/10/19 0613 02/11/19 0445 02/12/19 0500  AST 153* 190* 100* 266* 279* 232* 246*  ALT 26 31 88* 51* 69* 67* 74*  ALKPHOS 206* 239* 148* 325* 399* 433* 402*  BILITOT 29.2* 25.5* 3.9* 34.3* 33.8* 34.2* 34.4*  PROT 5.1* 5.5* 5.7* 5.2* 4.9* 5.2* 5.5*  ALBUMIN 2.1* 2.2* 1.7* 1.8* 1.6* 1.7* 2.0*  2.0*  INR 1.3* 1.2  --   --  1.3* 1.2 1.2     INFECTIOUS Recent Labs  Lab 02/12/19 0010 02/12/19 0500  LATICACIDVEN 1.6  --   PROCALCITON  --  4.52     ENDOCRINE CBG (last 3)  Recent Labs    02/11/19 2310 02/12/19 0325 02/12/19 0749  GLUCAP 138* 148* 110*     Resolved Hospital Problem list     Assessment & Plan:  ASSESSMENT / PLAN:  PULMONARY A:  Acute resp failure at admit 01/31/2019 due to Subdural but reintubated 6/13 due to encephalopathy P:   Continue PRVC 8 cc/kg Okay to perform PS as tolerated but she does not have the mental status for successful extubation   NEUROLOGIC A:   Acute encepahlopathy -multifactorial encephalopathy in particular hepatic encephalopathy, and SDH P:   Minimize sedating medications Continue lactulose as ordered and follow ammonia for downtrend Appreciate neurosurgery assistance in management   VASCULAR A:    Circulatory shock, patient hypovolemic versus septic, possibly superimposed sedating medication P:  Continue norepinephrine, change MAP goal to 60   INFECTIOUS A:   No clear infectious etiology, at risk sepsis P:   Hepatitis panel negative Following off antibiotics  RENAL A:  Acute renal failure, unclear precipitating cause P:  CVVH running.  Based on conversations with husband 6/14 plan to undertake several day trial to see if we can achieve clinical stability.  Do not believe they would want prolonged hemodialysis support Discontinue bicarbonate 6/15   ELECTROLYTES A:  Hyperkalemia, improved P: Lokelma discontinued   GASTROINTESTINAL A:   Jaundice of unclear etiology.  Hepatic evaluation shows steatosis but overall does not support fulminant hepatic failure.  Question hemolysis, hepatic congestion.  Note hyperammonemia with stable LFT, stable INR.  CMV negative P:   Continue lactulose, follow ammonia Appreciate gastroenterology assistance Initial labs inconsistent with TTP, schistocytes negative.  ADAMTS-13 pending as below  HEMATOLOGIC A:  Anemia, question hemolysis Thromboctyopenia, peripheral smear does not support TTP P:  ADAMTS-13 activity pending  ENDOCRINE A:   At risk hyper/hypoglycemia   P:   Sliding-scale insulin as ordered  MSK/DERM Groin and inframmamary - moisture related and painful STage 2 sacral decub v moisture related -   - both pre-admit P  - dressing     Best practice:  Diet: start tube feeding after intubation Pain/Anxiety/Delirium protocol (if indicated): yes, prn fentanyl, versed, RASS target 0 to -1 VAP protocol (if indicated): yes DVT prophylaxis: SCD GI prophylaxis: PPI Glucose control: SSI Mobility: bed rest Code Status: full  Family Communication:   6/6 - PCCM - called and spoke with the patient's daughter Taylor Postlex, telephone #[phone].  She also has a younger brother who is 504 years old.  They are currently making medical decisions for the patient.  Upon further discussion with her she does state that her mother is married.  They are separated but not legally divorced.  Her husband is at [employer] [place]  And the daughter has attempted to reach out to him this morning but there has been no answer.  His telephone number is [phone].  I have attempted to call the number provided by his daughter to reach [employer] and there was no answer.  6/7 - husband updated  6/13  - updated her husband Taylor Robinson on 6/13, they are separated.  He really doesn't  have  much clue about her prognosis.  I let him know that I think it is terrible and she is not a liver transplant candidate.  He voiced understanding.  He says her adult children are not really in a place to make decisions about her condition.  He recommended we proceed with intubaiton and supportive care and he would start talking to her kids about goals of care.  6/14 -  PCCM MD called Daughter Taylor Robinson [phone]) = aware patient back on vent. Reports she has a brother who is [phone] and apparently "knows everything". Daughter says she prefers first call is to the separated husband / step dad and he updates kids but ok to call her as well esp if we cannot get hold of him.  Updated - daughter is agreeable to DNR if arrest but still full medical care but wants to talk to step dad/separated husband. For now see outcome of TTP workup and renal consult . Later husband called and confirmed to RN NO CPR but full medical care. Cal husband first  6/15 -- Updated husband Taylor Robinson by phone today  Disposition: remain in ICU   Independent CC time 34 minutes  Levy Pupaobert Keenon Leitzel, MD, PhD 02/12/2019, 11:48 AM [facility] [phone] or if no answer [phone]

## 2019-02-13 ENCOUNTER — Encounter (HOSPITAL_COMMUNITY): Payer: Self-pay

## 2019-02-13 ENCOUNTER — Inpatient Hospital Stay (HOSPITAL_COMMUNITY): Payer: BC Managed Care – PPO

## 2019-02-13 LAB — CBC WITH DIFFERENTIAL/PLATELET
Abs Immature Granulocytes: 0.32 10*3/uL — ABNORMAL HIGH (ref 0.00–0.07)
Basophils Absolute: 0 10*3/uL (ref 0.0–0.1)
Basophils Relative: 0 %
Eosinophils Absolute: 0.1 10*3/uL (ref 0.0–0.5)
Eosinophils Relative: 1 %
HCT: 24.8 % — ABNORMAL LOW (ref 36.0–46.0)
Hemoglobin: 8.2 g/dL — ABNORMAL LOW (ref 12.0–15.0)
Immature Granulocytes: 3 %
Lymphocytes Relative: 6 %
Lymphs Abs: 0.6 10*3/uL — ABNORMAL LOW (ref 0.7–4.0)
MCH: 33.2 pg (ref 26.0–34.0)
MCHC: 33.1 g/dL (ref 30.0–36.0)
MCV: 100.4 fL — ABNORMAL HIGH (ref 80.0–100.0)
Monocytes Absolute: 0.4 10*3/uL (ref 0.1–1.0)
Monocytes Relative: 4 %
Neutro Abs: 8.5 10*3/uL — ABNORMAL HIGH (ref 1.7–7.7)
Neutrophils Relative %: 86 %
Platelets: 50 10*3/uL — ABNORMAL LOW (ref 150–400)
RBC: 2.47 MIL/uL — ABNORMAL LOW (ref 3.87–5.11)
RDW: 18.9 % — ABNORMAL HIGH (ref 11.5–15.5)
WBC: 10 10*3/uL (ref 4.0–10.5)
nRBC: 0.9 % — ABNORMAL HIGH (ref 0.0–0.2)

## 2019-02-13 LAB — RENAL FUNCTION PANEL
Albumin: 1.8 g/dL — ABNORMAL LOW (ref 3.5–5.0)
Albumin: 1.6 g/dL — ABNORMAL LOW (ref 3.5–5.0)
Anion gap: 12 (ref 5–15)
Anion gap: 11 (ref 5–15)
BUN: 38 mg/dL — ABNORMAL HIGH (ref 6–20)
BUN: 35 mg/dL — ABNORMAL HIGH (ref 6–20)
CO2: 23 mmol/L (ref 22–32)
CO2: 24 mmol/L (ref 22–32)
Calcium: 8.6 mg/dL — ABNORMAL LOW (ref 8.9–10.3)
Calcium: 8.7 mg/dL — ABNORMAL LOW (ref 8.9–10.3)
Chloride: 101 mmol/L (ref 98–111)
Chloride: 101 mmol/L (ref 98–111)
Creatinine, Ser: 1.19 mg/dL — ABNORMAL HIGH (ref 0.44–1.00)
Creatinine, Ser: 1.2 mg/dL — ABNORMAL HIGH (ref 0.44–1.00)
GFR calc Af Amer: 59 mL/min — ABNORMAL LOW (ref >60)
GFR calc Af Amer: 59 mL/min — ABNORMAL LOW (ref >60)
GFR calc non Af Amer: 51 mL/min — ABNORMAL LOW (ref >60)
GFR calc non Af Amer: 50 mL/min — ABNORMAL LOW (ref >60)
Glucose, Bld: 131 mg/dL — ABNORMAL HIGH (ref 70–99)
Glucose, Bld: 117 mg/dL — ABNORMAL HIGH (ref 70–99)
Phosphorus: 3.1 mg/dL (ref 2.5–4.6)
Phosphorus: 3.4 mg/dL (ref 2.5–4.6)
Potassium: 4.1 mmol/L (ref 3.5–5.1)
Potassium: 4.3 mmol/L (ref 3.5–5.1)
Sodium: 136 mmol/L (ref 135–145)
Sodium: 136 mmol/L (ref 135–145)

## 2019-02-13 LAB — HEPATIC FUNCTION PANEL
ALT: 91 U/L — ABNORMAL HIGH (ref 0–44)
AST: 310 U/L — ABNORMAL HIGH (ref 15–41)
Albumin: 1.8 g/dL — ABNORMAL LOW (ref 3.5–5.0)
Alkaline Phosphatase: 504 U/L — ABNORMAL HIGH (ref 38–126)
Bilirubin, Direct: 24 mg/dL — ABNORMAL HIGH (ref 0.0–0.2)
Indirect Bilirubin: 8.6 mg/dL — ABNORMAL HIGH (ref 0.3–0.9)
Total Bilirubin: 32.6 mg/dL (ref 0.3–1.2)
Total Protein: 5.4 g/dL — ABNORMAL LOW (ref 6.5–8.1)

## 2019-02-13 LAB — POCT I-STAT 7, (LYTES, BLD GAS, ICA,H+H)
Acid-Base Excess: 2 mmol/L (ref 0.0–2.0)
Bicarbonate: 25.1 mmol/L (ref 20.0–28.0)
Calcium, Ion: 1.15 mmol/L (ref 1.15–1.40)
HCT: 24 % — ABNORMAL LOW (ref 36.0–46.0)
Hemoglobin: 8.2 g/dL — ABNORMAL LOW (ref 12.0–15.0)
O2 Saturation: 97 %
Patient temperature: 97.5
Potassium: 4.2 mmol/L (ref 3.5–5.1)
Sodium: 137 mmol/L (ref 135–145)
TCO2: 26 mmol/L (ref 22–32)
pCO2 arterial: 30.8 mmHg — ABNORMAL LOW (ref 32.0–48.0)
pH, Arterial: 7.518 — ABNORMAL HIGH (ref 7.350–7.450)
pO2, Arterial: 79 mmHg — ABNORMAL LOW (ref 83.0–108.0)

## 2019-02-13 LAB — GLUCOSE, CAPILLARY
Glucose-Capillary: 112 mg/dL — ABNORMAL HIGH (ref 70–99)
Glucose-Capillary: 111 mg/dL — ABNORMAL HIGH (ref 70–99)
Glucose-Capillary: 118 mg/dL — ABNORMAL HIGH (ref 70–99)
Glucose-Capillary: 106 mg/dL — ABNORMAL HIGH (ref 70–99)
Glucose-Capillary: 110 mg/dL — ABNORMAL HIGH (ref 70–99)

## 2019-02-13 LAB — PROTIME-INR
INR: 1.1 (ref 0.8–1.2)
Prothrombin Time: 14.5 seconds (ref 11.4–15.2)

## 2019-02-13 LAB — MAGNESIUM: Magnesium: 2.4 mg/dL (ref 1.7–2.4)

## 2019-02-13 LAB — AMMONIA: Ammonia: 80 umol/L — ABNORMAL HIGH (ref 9–35)

## 2019-02-13 LAB — ADAMTS13 ANTIBODY: ADAMTS13 Antibody: 5 Units/mL (ref <12)

## 2019-02-13 LAB — LACTIC ACID, PLASMA: Lactic Acid, Venous: 2.3 mmol/L (ref 0.5–1.9)

## 2019-02-13 LAB — ADAMTS13 ACTIVITY: Adamts 13 Activity: 14.9 % (ref >66.8)

## 2019-02-13 LAB — HAPTOGLOBIN: Haptoglobin: 143 mg/dL (ref 33–346)

## 2019-02-13 MED ORDER — HEPARIN SODIUM (PORCINE) 5000 UNIT/ML IJ SOLN
5000.0000 [IU] | Freq: Three times a day (TID) | INTRAMUSCULAR | Status: DC
  Administered 2019-02-13 – 2019-02-14 (×6): 5000 [IU] via SUBCUTANEOUS
  Filled 2019-02-13 (×7): qty 1

## 2019-02-13 MED ORDER — SODIUM CHLORIDE 0.9 % IV SOLN
INTRAVENOUS | Status: DC | PRN
  Administered 2019-02-13 – 2019-02-15 (×2): 250 mL via INTRAVENOUS

## 2019-02-13 MED ORDER — PRO-STAT SUGAR FREE PO LIQD
60.0000 mL | Freq: Two times a day (BID) | ORAL | Status: DC
  Administered 2019-02-13 – 2019-02-15 (×4): 60 mL
  Filled 2019-02-13 (×4): qty 60

## 2019-02-13 MED ORDER — SODIUM CHLORIDE 0.9 % IV SOLN
2.0000 g | Freq: Two times a day (BID) | INTRAVENOUS | Status: DC
  Administered 2019-02-13 – 2019-02-15 (×4): 2 g via INTRAVENOUS
  Filled 2019-02-13 (×6): qty 2

## 2019-02-13 MED ORDER — VITAL AF 1.2 CAL PO LIQD
1000.0000 mL | ORAL | Status: DC
  Administered 2019-02-13 – 2019-02-14 (×2): 1000 mL

## 2019-02-13 MED ORDER — HYDRALAZINE HCL 20 MG/ML IJ SOLN: 10.0000 mg | INTRAMUSCULAR | Status: DC | PRN

## 2019-02-13 NOTE — Progress Notes (Signed)
Pharmacy Antibiotic Note  Taylor Robinson is a 58 y.o. female admitted on March 02, 2019 with sepsis and UTI.  Pharmacy has been consulted for cefepime dosing. WBC 10, AKI on CRRT, Lac 1.6>2.3  6/15 Urine culture grew >100,000 Pseudomonas aeruginosa.   Plan: Cefepime 2g q12h F/u UCx sensitivies,LOT/de-escalation, clinical status, and renal function/CRRT status   Height: 5\' 6"  (167.6 cm) Weight: 227 lb 4.7 oz (103.1 kg) IBW/kg (Calculated) : 59.3  Temp (24hrs), Avg:98.1 F (36.7 C), Min:97.2 F (36.2 C), Max:99.1 F (37.3 C)  Recent Labs  Lab 02/09/19 0500 02/10/19 0613 02/11/19 0445 02/12/19 0010 02/12/19 0500 02/12/19 1920 02/13/19 0500  WBC 30.4* 24.8* 20.4*  --  14.0*  --  10.0  CREATININE 3.33* 3.96* 4.25*  --  2.63* 1.67* 1.19*  LATICACIDVEN  --   --   --  1.6  --   --  2.3*    Estimated Creatinine Clearance: 64 mL/min (A) (by C-G formula based on SCr of 1.19 mg/dL (H)).    No Known Allergies  Antimicrobials this admission: Cefepime 6/16 >>  Dose adjustments this admission:  Microbiology results: 6/6 BCx: negative 6/6 UCx: insignificant growth 6/15 UCx:  >100,000 Pseudomonas aeruginosa.  6/6 MRSA PCR: neg  Thank you for allowing pharmacy to be a part of this patient's care.  Harrietta Guardian, PharmD PGY1 Pharmacy Resident 02/13/2019    3:32 PM Please check AMION for all [place] numbers

## 2019-02-13 NOTE — Progress Notes (Signed)
Nutrition Follow-up  DOCUMENTATION CODES:   Obesity unspecified  INTERVENTION:   D/C Vital High Protein @ 50 ml/hr via Cortrak (1200 ml) 30 ml Prostat BID  Vital AF 1.2 @ 50 ml/hr (1200 ml) 60 ml Prostat BID  Provides: 1840 kcal, 150 grams protein, and 973 ml free water.    NUTRITION DIAGNOSIS:   Increased nutrient needs related to acute illness(liver failure, SDH) as evidenced by estimated needs.  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    6/6 s/p L craniotomy for SDH GI following for liver failure. CT shows severe fatty infiltration of the liver 6/11 extubated 6/12 failed swallow eval, gastric Cortrak placement  6/13- re-intubated  6/14 started on CVVHD  RD working remotely.  Per MD, pt not a liver transplant candidate. GOC to be discussed.   Patient is currently intubated on ventilator support MV: 12.7 L/min Temp (24hrs), Avg:98.3 F (36.8 C), Min:97.2 F (36.2 C), Max:99.1 F (37.3 C) MAP: 61-83  I/O: +6 L since admit UOP: 24 ml x 24 hrs  Deep pitting edema  Admission weight: 103.1 kg (will use to estimate needs) Current weight 106 kg  Drips: levophed @ 2 mcg Medications: lactulose,  Labs: Ammonia: 80 (H)   Diet Order:   Diet Order            Diet NPO time specified  Diet effective now              EDUCATION NEEDS:   No education needs have been identified at this time  Skin:  Skin Assessment: (MASD: abdomen, groin, breast, perineum, sacrum)  Last BM:  6/15 x 2  Height:   Ht Readings  from Last 1 Encounters:  02/10/19 5\' 6"  (1.676 m)    Weight:   Wt Readings from Last 1 Encounters:  02/13/19 103.1 kg    Ideal Body Weight:  59 kg  BMI:  Body mass index is 36.69 kg/m.  Estimated Nutritional Needs:   Kcal:  1932  Protein:  118-148 gram  Fluid:  >/= 1.5 L/day  Maylon Peppers RD, LDN, CNSC [phone] Pager [phone] After Hours Pager

## 2019-02-13 NOTE — Progress Notes (Signed)
NAME:  Taylor HammanDee Cermak, MRN:  [id number], DOB:  01/30/1961, LOS: 10 ADMISSION DATE:  02/10/2019, CONSULTATION DATE:  02/20/2019 REFERRING MD:  Clarene DukeLittle, CHIEF COMPLAINT:  Confusion   Brief History   58 y/o female admitted form home with jaundice, nausea and vomiting which developed over weeks, eventually developed confusion and found to have a subdural hemorrhage.  Required hematoma evacuation by neurosurgery on 6/6.   Past Medical History  Hypothyroidism, DM2, GERD, obesity.  Significant Hospital Events   6/5 - smear review - normocytic anemia  6/6 craniotomy - [facility] - decision to give vit k and Rx [facility]  6/7  GI consult - Etiology of hyperbilirubinemia is unclear; multiple serologies are pending - "  Profound hyperbilirubinemia with much less elevated other liver function tests.  I am not sure this is liver failure, but assessment under current patient condition (intubation, minimally arousable) is difficult.  Relative normal INR argues at least against progressive liver failure.  No evidence of biliary obstruction".   6/10 - GI consult - supportive care. No role for steroids, Serologies negative. Liver bx risk felt to outweigh benefits - "It is felt this is most likely a drug reaction, superimposed likely on chronic hepatic steatosis, but certainly hemolysis from intracranial bleeding could lead to an enhanced degree of hyperbilirubinemia.". GI d/w [facility] Dr Sharlene MottsBerg - not felt to be a transplant candidate because INR normal and not felt to be liver failure + recent craniotomy  6/11 extubated  6/13 - reintubated and central line - WBC elevated, INR OK, Bili higher today, Mental status worse. Ammonia level 107   6/14 - Started on CVVH  Consults:  NSGY GI Nephrology  Procedures:  6/6 ETT > 6/11, 6/13 >> 6/6 L Craniotomy for subdural hematoma CVC 6/13 >> HD cath 6/14 >>   Significant Diagnostic Tests:  6/5 CTH >>Large subdural hematoma along the left frontoparietal  convexity measuring 2.6 cm in width and causing a 6 mm rightward midline shift. 6/5 CT abd/pelvis + 6/5  CT chest: No acute intrathoracic pathology. Underdistention of the ascending colon versus less likely mild Colitis. Severe fatty infiltration of the liver. 6/6 RUQ ultrasound > steatosis, gludge CT head 6/7  reveals improvement in shift after evacuation, new focus of bleeding posterior temporal.  6/12 RUQ ultrasound > sludge in gallbladder lumen, hepatic steatosis  Micro Data:  6/5 SARS coronavirus 2 >> negative 6/5 BCx 2 >>  negative 6/6 BCx >> negative 6/5 UC >> insignificant growth 6/15 UC >>   Antimicrobials:  6/6 cefazolin x1  Interim history/subjective:  Continues on CVVH, vent On low-dose Levophed  Objective   Blood pressure 116/73, pulse 81, temperature (!) 97.5 F (36.4 C), temperature source Axillary, resp. rate (!) 28, height 5\' 6"  (1.676 m), weight 103.1 kg, SpO2 100 %. CVP:  [9 mmHg-21 mmHg] 12 mmHg  Vent Mode: PRVC FiO2 (%):  [30 %] 30 % Set Rate:  [18 bmp] 18 bmp Vt Set:  [470 mL] 470 mL PEEP:  [5 cmH20] 5 cmH20   Intake/Output Summary (Last 24 hours) at 02/13/2019 0824 Last data filed at 02/13/2019 0800 Gross per 24 hour  Intake 1639.64 ml  Output 4698 ml  Net -3058.36 ml   Filed Weights   02/11/19 0431 02/12/19 0400 02/13/19 0500  Weight: 104.9 kg 108.6 kg 103.1 kg   Gen:      No acute distress HEENT:  EOMI, jaundiced, dry around lips Neck:     No masses; no  thyromegaly, ETT Lungs:    Clear to auscultation bilaterally; normal respiratory effort CV:         Regular rate and rhythm; no murmurs Abd:      + bowel sounds; soft, non-tender; no palpable masses, no distension Ext:    No edema; adequate peripheral perfusion Skin:      Warm and dry; no rash Neuro: Sedated, grimaces to stimulation  Resolved Hospital Problem list   Hyperkalemia, improved  Assessment & Plan:  ASSESSMENT / PLAN:  PULMONARY A:  Acute resp failure at admit 01/29/2019 due to  Subdural but reintubated 6/13 due to encephalopathy P:   Continue ARDSnet ventilation PSV trials as tolerated Mental status is a barrier to extubation  NEUROLOGIC A:   Acute encepahlopathy -multifactorial encephalopathy in particular hepatic encephalopathy, and SDH P:   Minimize sedation Neuro monitoring  VASCULAR A:    Circulatory shock, patient hypovolemic versus septic, possibly superimposed sedating medication P:  Weaning down sedation  INFECTIOUS A:   No clear infectious etiology, at risk sepsis P:   Hepatitis panel negative Following off antibiotics  RENAL A:  Acute renal failure, unclear precipitating cause P:  On CVVH.  Based on conversations with husband 6/14 plan to undertake several day trial to see if we can achieve clinical stability.  Do not believe they would want prolonged hemodialysis support  GASTROINTESTINAL A:   Jaundice of unclear etiology.  Hepatic evaluation shows steatosis but overall does not support fulminant hepatic failure.  Question hemolysis, hepatic congestion.  Note hyperammonemia with stable LFT, stable INR.  CMV negative Initial labs inconsistent with TTP, schistocytes negative P:   Continue lactulose, follow ammonia Appreciate gastroenterology assistance. Not a candidate for liver transplant eval  HEMATOLOGIC A:  Anemia, question hemolysis Thromboctyopenia, peripheral smear does not support TTP P:  ADAMTS-13 activity pending  ENDOCRINE A:   At risk hyper/hypoglycemia   P:   Sliding-scale insulin as ordered  MSK/DERM Groin and inframmamary - moisture related and painful STage 2 sacral decub v moisture related -   - both pre-admit P Wound care  Best practice:  Diet: Tube feeds Pain/Anxiety/Delirium protocol (if indicated): yes, prn fentanyl, versed, RASS target 0 to -1 VAP protocol (if indicated): yes DVT prophylaxis: SCD, Hep SQ GI prophylaxis: PPI Glucose control: SSI Mobility: bed rest Code Status: Limited code   Family Communication:  6/6 - PCCM - called and spoke with the patient's daughter Trinna Postlex, telephone #[phone]- [phone].  She also has a younger brother who is 56 years old.  They are currently making medical decisions for the patient.  Upon further discussion with her she does state that her mother is married.  They are separated but not legally divorced.  Her husband is at [employer] [place]  And the daughter has attempted to reach out to him this morning but there has been no answer.  His telephone number is [phone].  I have attempted to call the number provided by his daughter to reach [employer] and there was no answer.  6/7 - husband updated  6/13  - updated her husband Harvie HeckRandy on 6/13, they are separated.  He really doesn't have much clue about her prognosis.  I let him know that I think it is terrible and she is not a liver transplant candidate.  He voiced understanding.  He says her adult children are not really in a place to make decisions about her condition.  He recommended we proceed with intubaiton and supportive care  and he would start talking to her kids about goals of care.  6/14 -  PCCM MD called Daughter Docia Chucklex Carey [phone]) = aware patient back on vent. Reports she has a brother who is [phone] and apparently "knows everything". Daughter says she prefers first call is to the separated husband / step dad and he updates kids but ok to call her as well esp if we cannot get hold of him.  Updated - daughter is agreeable to DNR if arrest but still full medical care but wants to talk to step dad/separated husband. For now see outcome of TTP workup and renal consult . Later husband called and confirmed to RN NO CPR but full medical care. Call husband first  6/15. 6/16 -- Updated husband Harvie HeckRandy by phone today  Disposition: remain in ICU   LABS    PULMONARY Recent Labs  Lab 02/10/19 1236 02/11/19 1148 02/13/19 0420  PHART 7.262* 7.418 7.518*  PCO2ART 46.4 30.8*  30.8*  PO2ART 110.0* 159.0* 79.0*  HCO3 20.9 19.9* 25.1  TCO2 22 21* 26  O2SAT 97.0 99.0 97.0    CBC Recent Labs  Lab 02/11/19 0445  02/12/19 0500 02/13/19 0420 02/13/19 0500  HGB 8.5*   < > 7.9* 8.2* 8.2*  HCT 25.4*   < > 24.0* 24.0* 24.8*  WBC 20.4*  --  14.0*  --  10.0  PLT 82*  --  61*  --  50*   < > = values in this interval not displayed.    COAGULATION Recent Labs  Lab 02/07/19 0448 02/10/19 0613 02/11/19 0445 02/12/19 0500 02/13/19 0500  INR 1.2 1.3* 1.2 1.2 1.1    CARDIAC  No results for input(s): TROPONINI in the last 168 hours. No results for input(s): PROBNP in the last 168 hours.   CHEMISTRY Recent Labs  Lab 02/10/19 0613 02/10/19 1206  02/10/19 1741 02/11/19 0445 02/11/19 1148 02/12/19 0500 02/12/19 1920 02/13/19 0420 02/13/19 0500  NA 137  --    < >  --  135 139 137 136 137 136  K 4.8  --    < >  --  5.1 5.5* 4.3 4.2 4.2 4.1  CL 105  --   --   --  104  --  103 101  --  101  CO2 19*  --   --   --  17*  --  21* 23  --  23  GLUCOSE 171*  --   --   --  122*  --  136* 105*  --  131*  BUN 103*  --   --   --  115*  --  80* 50*  --  38*  CREATININE 3.96*  --   --   --  4.25*  --  2.63* 1.67*  --  1.19*  CALCIUM 8.1*  --   --   --  8.3*  --  8.4* 8.5*  --  8.6*  MG  --  2.2  --  2.3 2.3  --  2.4  --   --  2.4  PHOS  --  3.7  --  4.2 4.2  --  4.4 3.2  --  3.1   < > = values in this interval not displayed.   Estimated Creatinine Clearance: 64 mL/min (A) (by C-G formula based on SCr of 1.19 mg/dL (H)).   LIVER Recent Labs  Lab 02/07/19 0448  02/09/19 0500 02/10/19 0613 02/11/19 0445 02/12/19 0500 02/12/19 1920 02/13/19 0500  AST  190*   < > 266* 279* 232* 246*  --  310*  ALT 31   < > 51* 69* 67* 74*  --  91*  ALKPHOS 239*   < > 325* 399* 433* 402*  --  504*  BILITOT 25.5*   < > 34.3* 33.8* 34.2* 34.4*  --  32.6*  PROT 5.5*   < > 5.2* 4.9* 5.2* 5.5*  --  5.4*  ALBUMIN 2.2*   < > 1.8* 1.6* 1.7* 2.0*  2.0* 1.8* 1.8*  1.8*  INR 1.2  --    --  1.3* 1.2 1.2  --  1.1   < > = values in this interval not displayed.     INFECTIOUS Recent Labs  Lab 02/12/19 0010 02/12/19 0500 02/13/19 0500  LATICACIDVEN 1.6  --  2.3*  PROCALCITON  --  4.52  --      ENDOCRINE CBG (last 3)  Recent Labs    02/12/19 2323 02/13/19 0339 02/13/19 0758  GLUCAP 109* 112* 111*    The patient is critically ill with multiple organ system failure and requires high complexity decision making for assessment and support, frequent evaluation and titration of therapies, advanced monitoring, review of radiographic studies and interpretation of complex data.   Critical Care Time devoted to patient care services, exclusive of separately billable procedures, described in this note is 35 minutes.   Marshell Garfinkel MD [facility] Pager [phone] If no answer call [phone] 02/13/2019, 8:27 AM

## 2019-02-13 NOTE — Progress Notes (Signed)
CRITICAL VALUE ALERT  Critical Value:  Adamts 13 activity 14.9  Date & Time Notied:  02/13/2019 2125  Provider Notified: Warren Lacy  Orders Received/Actions taken: no new orders at this time

## 2019-02-13 NOTE — Progress Notes (Signed)
Patient transported from 4N ICU to 3M09 without any apparent complications.

## 2019-02-13 NOTE — Progress Notes (Signed)
Subjective: Interval History: on vent opens eyes occ  Objective: Vital signs in last 24 hours: Temp:  [97.2 F (36.2 C)-99.1 F (37.3 C)] 97.2 F (36.2 C) (06/16 0800) Pulse Rate:  [66-89] 74 (06/16 1000) Resp:  [21-35] 32 (06/16 1000) BP: (81-155)/(43-113) 129/113 (06/16 1000) SpO2:  [96 %-100 %] 97 % (06/16 1000) Arterial Line BP: (79-153)/(37-61) 117/46 (06/16 1000) FiO2 (%):  [30 %] 30 % (06/16 0731) Weight:  [103.1 kg] 103.1 kg (06/16 0500) Weight change: -5.5 kg  Intake/Output from previous day: 06/15 0701 - 06/16 0700 In: 1697.3 [I.V.:177.3; NG/GT:1300] Out: 4698 [Urine:24] Intake/Output this shift: Total I/O In: 286.8 [I.V.:6.8; Other:130; NG/GT:150] Out: 599 [Urine:3; Other:596]  General appearance: on vent, not responsive, jaundiced Neck: IJ cath Resp: rhonchi bilaterally Cardio: S1, S2 normal GI: obese, pos bs distended Extremities: edema 3+  Lab Results: Recent Labs    02/12/19 0500 02/13/19 0420 02/13/19 0500  WBC 14.0*  --  10.0  HGB 7.9* 8.2* 8.2*  HCT 24.0* 24.0* 24.8*  PLT 61*  --  50*   BMET:  Recent Labs    02/12/19 1920 02/13/19 0420 02/13/19 0500  NA 136 137 136  K 4.2 4.2 4.1  CL 101  --  101  CO2 23  --  23  GLUCOSE 105*  --  131*  BUN 50*  --  38*  CREATININE 1.67*  --  1.19*  CALCIUM 8.5*  --  8.6*   No results for input(s): PTH in the last 72 hours. Iron Studies: No results for input(s): IRON, TIBC, TRANSFERRIN, FERRITIN in the last 72 hours.  Studies/Results: Dg Chest Port 1 View  Result Date: 02/13/2019 CLINICAL DATA:  Order for acute respiratory failure EXAM: PORTABLE CHEST 1 VIEW COMPARISON:  Chest x-rays dated 02/12/2019 and 02/11/2019. FINDINGS: Tubes and lines are stable in position. Stable cardiomegaly. Bibasilar opacities are unchanged. No pleural effusion or pneumothorax seen. IMPRESSION: Stable chest x-ray. Support apparatus is stable. Bibasilar opacities are unchanged, atelectasis versus pneumonia. Stable  cardiomegaly. Electronically Signed   By: Franki Cabot M.D.   On: 02/13/2019 08:26   Dg Chest Port 1 View  Result Date: 02/12/2019 CLINICAL DATA:  Intubation. EXAM: PORTABLE CHEST 1 VIEW COMPARISON:  02/11/2019.  02/04/2019. FINDINGS: Interim extubation. Feeding tube and bilateral IJ lines in stable position. Stable cardiomegaly. Bilateral pulmonary interstitial prominence, progressed from prior exam. A component of CHF could present this fashion. Bibasilar atelectasis/infiltrates again noted. No pneumothorax. IMPRESSION: 1. Interim extubation. Feeding tube and bilateral IJ lines in stable position. 2. Stable cardiomegaly. Bilateral from interstitial prominence, progressed from prior exam. A component of CHF could present this fashion. 3.  Bibasilar atelectasis/infiltrates again Electronically Signed   By: Marcello Moores  Register   On: 02/12/2019 08:04   Dg Chest Port 1 View  Result Date: 02/11/2019 CLINICAL DATA:  Central line clotted EXAM: PORTABLE CHEST 1 VIEW COMPARISON:  Portable exam 1556 hours compared to 02/10/2019 FINDINGS: Tip of endotracheal tube projects 6.8 cm above carina. Feeding tube extends into stomach. New RIGHT jugular central venous catheter with tip projecting over SVC. LEFT jugular line unchanged. Enlargement of cardiac silhouette. Mediastinal contours normal. Low lung volumes with bibasilar atelectasis. No acute infiltrate, pleural effusion or pneumothorax. IMPRESSION: No pneumothorax following RIGHT jugular line placement. Enlargement of cardiac silhouette with bibasilar atelectasis. Electronically Signed   By: Lavonia Dana M.D.   On: 02/11/2019 16:35    I have reviewed the patient's current medications.  Assessment/Plan: 1 AKI apparent ATN.  CRRT with good  solute, acid/base/K control. Vol improving.   2 Acute liver failure  Prob drug induced 3 Obesity 4 SDH 5 VDRF 6 Nutrition P CRRT, lower vol, cont vent, TF    LOS: 10 days   Reise Hietala 02/13/2019,11:17 AM

## 2019-02-14 ENCOUNTER — Inpatient Hospital Stay (HOSPITAL_COMMUNITY): Payer: BC Managed Care – PPO

## 2019-02-14 ENCOUNTER — Encounter (HOSPITAL_COMMUNITY): Payer: Self-pay | Admitting: Oncology

## 2019-02-14 DIAGNOSIS — K7200 Acute and subacute hepatic failure without coma: Secondary | ICD-10-CM

## 2019-02-14 DIAGNOSIS — D696 Thrombocytopenia, unspecified: Secondary | ICD-10-CM

## 2019-02-14 DIAGNOSIS — D649 Anemia, unspecified: Secondary | ICD-10-CM

## 2019-02-14 LAB — GLUCOSE, CAPILLARY
Glucose-Capillary: 104 mg/dL — ABNORMAL HIGH (ref 70–99)
Glucose-Capillary: 107 mg/dL — ABNORMAL HIGH (ref 70–99)
Glucose-Capillary: 115 mg/dL — ABNORMAL HIGH (ref 70–99)
Glucose-Capillary: 129 mg/dL — ABNORMAL HIGH (ref 70–99)
Glucose-Capillary: 21 mg/dL — CL (ref 70–99)
Glucose-Capillary: 45 mg/dL — ABNORMAL LOW (ref 70–99)
Glucose-Capillary: 79 mg/dL (ref 70–99)
Glucose-Capillary: 96 mg/dL (ref 70–99)
Glucose-Capillary: 99 mg/dL (ref 70–99)

## 2019-02-14 LAB — BLOOD GAS, ARTERIAL
Acid-base deficit: 0.9 mmol/L (ref 0.0–2.0)
Bicarbonate: 22.1 mmol/L (ref 20.0–28.0)
Drawn by: 42624
FIO2: 30
MECHVT: 470 mL
O2 Saturation: 90.1 %
PEEP: 5 cmH2O
Patient temperature: 98.6
RATE: 18 resp/min
pCO2 arterial: 29.9 mmHg — ABNORMAL LOW (ref 32.0–48.0)
pH, Arterial: 7.482 — ABNORMAL HIGH (ref 7.350–7.450)
pO2, Arterial: 58.9 mmHg — ABNORMAL LOW (ref 83.0–108.0)

## 2019-02-14 LAB — CBC WITH DIFFERENTIAL/PLATELET
Abs Immature Granulocytes: 1.15 10*3/uL — ABNORMAL HIGH (ref 0.00–0.07)
Basophils Absolute: 0.1 10*3/uL (ref 0.0–0.1)
Basophils Relative: 0 %
Eosinophils Absolute: 0.1 10*3/uL (ref 0.0–0.5)
Eosinophils Relative: 1 %
HCT: 27.1 % — ABNORMAL LOW (ref 36.0–46.0)
Hemoglobin: 8.8 g/dL — ABNORMAL LOW (ref 12.0–15.0)
Immature Granulocytes: 6 %
Lymphocytes Relative: 6 %
Lymphs Abs: 1.2 10*3/uL (ref 0.7–4.0)
MCH: 33 pg (ref 26.0–34.0)
MCHC: 32.5 g/dL (ref 30.0–36.0)
MCV: 101.5 fL — ABNORMAL HIGH (ref 80.0–100.0)
Monocytes Absolute: 0.6 10*3/uL (ref 0.1–1.0)
Monocytes Relative: 3 %
Neutro Abs: 16.1 10*3/uL — ABNORMAL HIGH (ref 1.7–7.7)
Neutrophils Relative %: 84 %
Platelets: 58 10*3/uL — ABNORMAL LOW (ref 150–400)
RBC: 2.67 MIL/uL — ABNORMAL LOW (ref 3.87–5.11)
RDW: 19.4 % — ABNORMAL HIGH (ref 11.5–15.5)
WBC: 19.3 10*3/uL — ABNORMAL HIGH (ref 4.0–10.5)
nRBC: 4 % — ABNORMAL HIGH (ref 0.0–0.2)

## 2019-02-14 LAB — BILIRUBIN, FRACTIONATED(TOT/DIR/INDIR)
Bilirubin, Direct: 26.7 mg/dL — ABNORMAL HIGH (ref 0.0–0.2)
Indirect Bilirubin: 8.9 mg/dL — ABNORMAL HIGH (ref 0.3–0.9)
Total Bilirubin: 35.6 mg/dL (ref 0.3–1.2)

## 2019-02-14 LAB — URINE CULTURE: Culture: >=100000 — AB

## 2019-02-14 LAB — COMPREHENSIVE METABOLIC PANEL
ALT: 117 U/L — ABNORMAL HIGH (ref 0–44)
AST: 382 U/L — ABNORMAL HIGH (ref 15–41)
Albumin: 1.6 g/dL — ABNORMAL LOW (ref 3.5–5.0)
Alkaline Phosphatase: 620 U/L — ABNORMAL HIGH (ref 38–126)
Anion gap: 14 (ref 5–15)
BUN: 27 mg/dL — ABNORMAL HIGH (ref 6–20)
CO2: 21 mmol/L — ABNORMAL LOW (ref 22–32)
Calcium: 8.5 mg/dL — ABNORMAL LOW (ref 8.9–10.3)
Chloride: 100 mmol/L (ref 98–111)
Creatinine, Ser: 0.96 mg/dL (ref 0.44–1.00)
GFR calc Af Amer: >60 mL/min (ref >60)
GFR calc non Af Amer: >60 mL/min (ref >60)
Glucose, Bld: 126 mg/dL — ABNORMAL HIGH (ref 70–99)
Potassium: 4.7 mmol/L (ref 3.5–5.1)
Sodium: 135 mmol/L (ref 135–145)
Total Bilirubin: 35.8 mg/dL (ref 0.3–1.2)
Total Protein: 5.7 g/dL — ABNORMAL LOW (ref 6.5–8.1)

## 2019-02-14 LAB — RENAL FUNCTION PANEL
Albumin: 1.5 g/dL — ABNORMAL LOW (ref 3.5–5.0)
Anion gap: 18 — ABNORMAL HIGH (ref 5–15)
BUN: 24 mg/dL — ABNORMAL HIGH (ref 6–20)
CO2: 18 mmol/L — ABNORMAL LOW (ref 22–32)
Calcium: 8.5 mg/dL — ABNORMAL LOW (ref 8.9–10.3)
Chloride: 100 mmol/L (ref 98–111)
Creatinine, Ser: 0.87 mg/dL (ref 0.44–1.00)
GFR calc Af Amer: >60 mL/min (ref >60)
GFR calc non Af Amer: >60 mL/min (ref >60)
Glucose, Bld: 75 mg/dL (ref 70–99)
Phosphorus: 5.2 mg/dL — ABNORMAL HIGH (ref 2.5–4.6)
Potassium: 4.9 mmol/L (ref 3.5–5.1)
Sodium: 136 mmol/L (ref 135–145)

## 2019-02-14 LAB — DIC (DISSEMINATED INTRAVASCULAR COAGULATION)PANEL
D-Dimer, Quant: 5.15 ug/mL-FEU — ABNORMAL HIGH (ref 0.00–0.50)
Fibrinogen: >800 mg/dL — ABNORMAL HIGH (ref 210–475)
INR: 1.5 — ABNORMAL HIGH (ref 0.8–1.2)
Platelets: 38 10*3/uL — ABNORMAL LOW (ref 150–400)
Prothrombin Time: 17.5 seconds — ABNORMAL HIGH (ref 11.4–15.2)
Smear Review: NONE SEEN
aPTT: 49 seconds — ABNORMAL HIGH (ref 24–36)

## 2019-02-14 LAB — DIRECT ANTIGLOBULIN TEST (NOT AT ARMC)
DAT, IgG: NEGATIVE
DAT, complement: NEGATIVE

## 2019-02-14 LAB — RETICULOCYTES
Immature Retic Fract: 28.6 % — ABNORMAL HIGH (ref 2.3–15.9)
RBC.: 2.6 MIL/uL — ABNORMAL LOW (ref 3.87–5.11)
Retic Count, Absolute: 214.5 10*3/uL — ABNORMAL HIGH (ref 19.0–186.0)
Retic Ct Pct: 8.1 % — ABNORMAL HIGH (ref 0.4–3.1)

## 2019-02-14 LAB — PHOSPHORUS: Phosphorus: 3.5 mg/dL (ref 2.5–4.6)

## 2019-02-14 LAB — LACTATE DEHYDROGENASE: LDH: 615 U/L — ABNORMAL HIGH (ref 98–192)

## 2019-02-14 LAB — VITAMIN B12: Vitamin B-12: 4397 pg/mL — ABNORMAL HIGH (ref 180–914)

## 2019-02-14 LAB — MAGNESIUM: Magnesium: 2.5 mg/dL — ABNORMAL HIGH (ref 1.7–2.4)

## 2019-02-14 LAB — SAVE SMEAR(SSMR), FOR PROVIDER SLIDE REVIEW

## 2019-02-14 MED ORDER — EPINEPHRINE PF 1 MG/ML IJ SOLN
0.5000 ug/min | INTRAVENOUS | Status: DC
  Administered 2019-02-15: 20 ug/min via INTRAVENOUS
  Administered 2019-02-15: 03:00:00 0.5 ug/min via INTRAVENOUS
  Filled 2019-02-14 (×3): qty 4

## 2019-02-14 MED ORDER — DEXTROSE 50 % IV SOLN
INTRAVENOUS | Status: AC
  Administered 2019-02-14: 20:00:00 50 mL
  Filled 2019-02-14: qty 50

## 2019-02-14 MED ORDER — DEXTROSE 10 % IV SOLN
INTRAVENOUS | Status: DC
  Administered 2019-02-14 – 2019-02-15 (×2): via INTRAVENOUS

## 2019-02-14 MED ORDER — VASOPRESSIN 20 UNIT/ML IV SOLN
0.0300 [IU]/min | INTRAVENOUS | Status: DC
  Administered 2019-02-14: 0.03 [IU]/min via INTRAVENOUS
  Filled 2019-02-14 (×2): qty 2

## 2019-02-14 MED ORDER — DEXTROSE 50 % IV SOLN
INTRAVENOUS | Status: AC
  Administered 2019-02-14: 23:00:00 50 mL
  Filled 2019-02-14: qty 50

## 2019-02-14 NOTE — Progress Notes (Signed)
Removed staples on surgical incision per MD care order.  Steri Strips applied on incision when staples were removed.  Small area towards the posterior side was slightly bleeding and steri strips would not stay.  Applied Ice pack to stop bleeding.  Irven Baltimore, RN

## 2019-02-14 NOTE — Progress Notes (Signed)
Patient ID: Taylor Robinson, female   DOB: 1960/10/25, 58 y.o.   MRN: [id number] Pt is stable neurologically. She is now DNR except intubation and pressors. Therefore, I will sign off but am available if I can be of any assistance.

## 2019-02-14 NOTE — Progress Notes (Signed)
Hypoglycemic Event  CBG: 21  Treatment: D50 50 mL (25 gm)  Symptoms: None  Follow-up CBG: Time:2332 CBG Result:129  Possible Reasons for Event: Unknown  Comments/MD notified:ELINK notified, D10 started @75mL Lucilla Edin

## 2019-02-14 NOTE — Consult Note (Addendum)
[facility]  [phone] [phone]  ID: Taylor Robinson DOB: [birth year] MR#: [id number] ZHY#:[id number] PCP: Hermine Messick, MD  CHIEF COMPLAINT: Anemia and thrombocytopenia in the setting of jaundice and hyperbilirubinemia.   INTERVAL HISTORY: Taylor Robinson is a 58 year old female from [place], [place] with a past medical history including Diabetes, GERD, obesity, and thyroid disease. She presented to the hospital with progressive jaundice over several weeks with nausea, vomiting, abdominal pain, and worsening altered mental status/lethargy.  On presentation she was noted to have hyponatremia with a sodium level of 116, elevated total bilirubin of 31.4, direct bilirubin 19.6, alkaline phosphatase 241, ALT 56, AST 173, elevated white cell count of 11.2, hemoglobin 11.1 and a normal platelet count.  MELD score was calculated to be 25.  Acute hepatitis panel was negative.  A CT of the head without contrast was performed which showed a large subdural hematoma along the left frontoparietal convexity measuring 2.6 cm in width and causing a 6 mm rightward midline shift.  A CT of the chest, abdomen, pelvis was obtained which showed no acute intrathoracic pathology, underdistention of the ascending colon versus less likely mild colitis, and severe fatty infiltration of the liver.  Evacuation of the hematoma was performed by neurosurgery on 02/25/2019.  The patient underwent a GI consult on 02/04/2019 which indicated that the etiology of the hyperbilirubinemia was unclear and that there is no evidence of biliary obstruction.  Recommend supportive care.  There was no role for steroids from their standpoint.  Liver biopsy was not recommended as the risks were felt to outweigh the benefits.  It was thought that this was likely a drug reaction superimposed on top of chronic hepato-steatosis.  This was discussed with GI at [facility] who felt the patient was not transplant candidate because of her normal INR and  recent craniotomy.  The patient was reintubated on 02/10/2019 due to worsening mental status, rising ammonia level, and rising bilirubin.  The patient remains intubated and on norepinephrine.  The patient had gradual worsening of her renal function with a creatinine up to 4.25 and is currently receiving CCRT.  Reviewed the patient's lab work from this admission showed that her hemoglobin dropped down to 6.8 on 02/06/2019 and she received 1 unit of packed red blood cells, however hemoglobin has been primarily in the 7-8 range during this admission.  To date, the patient has only received 1 unit of packed red blood cells during this admission.  Her platelet count was normal on admission but has been slowly declining.  The platelets have been in the 50-60,000 range for the past 3 days.  She has not required any platelet transfusion.  ADAMTS 13 activity was sent and was low at 14.9%.  Prior CBC from 05/07/2018 from an outside health system showed a normal white blood cell count and platelet count and hemoglobin was mildly elevated at 16.1.  Most recent chemistry performed on 06/09/2018 and outside health system showed an elevated AST at 130, elevated ALT at 109 and normal total bilirubin at 0.4.  Hematology was asked see the patient to make recommendations regarding her anemia and thrombocytopenia.  REVIEW OF SYSTEMS: A comprehensive review of systems cannot be obtained due to the patient condition.  PAST MEDICAL HISTORY: Past Medical History:  Diagnosis Date  . Diabetes mellitus without complication ([place])   . GERD (gastroesophageal reflux disease)   . Thyroid disease    PAST SURGICAL HISTORY: Past Surgical History:  Procedure Laterality Date  . CRANIOTOMY  Left 02/24/2019   Procedure: CRANIOTOMY HEMATOMA EVACUATION SUBDURAL;  Surgeon: Eustace Moore, MD;  Location: [facility];  Service: Neurosurgery;  Laterality: Left;   FAMILY HISTORY History reviewed. No pertinent family history.  SOCIAL HISTORY: The patient  is married. Lives in [place], [place].   ADVANCED DIRECTIVES: None completed. She is a partial code.  HEALTH MAINTENANCE: Social History   Tobacco Use  . Smoking status: Unknown If Ever Smoked  Substance Use Topics  . Alcohol use: Not on file  . Drug use: Not on file   Colonoscopy: She has not had a colonoscopy PAP: Unknown Bone density: None Lipid panel: 06/09/2018  No Known Allergies Current Facility-Administered Medications  Medication Dose Route Frequency Provider Last Rate Last Dose  .  prismasol BGK 4/2.5 infusion   CRRT Continuous Mauricia Area, MD 800 mL/hr at 02/14/19 0731    .  prismasol BGK 4/2.5 infusion   CRRT Continuous Mauricia Area, MD 500 mL/hr at 02/14/19 0940    . 0.9 %  sodium chloride infusion (Manually program via Guardrails IV Fluids)   Intravenous Once Corey Harold, NP      . 0.9 %  sodium chloride infusion   Intravenous PRN Mannam, Praveen, MD 10 mL/hr at 02/14/19 1300    . alteplase (CATHFLO ACTIVASE) injection 2 mg  2 mg Intracatheter Once PRN Roney Jaffe, MD      . ceFEPIme (MAXIPIME) 2 g in sodium chloride 0.9 % 100 mL IVPB  2 g Intravenous Q12H Rush Barer, RPH   Stopped at 02/14/19 0400  . chlorhexidine gluconate (MEDLINE KIT) (PERIDEX) 0.12 % solution 15 mL  15 mL Mouth Rinse BID Minor, Grace Bushy, NP   15 mL at 02/14/19 0809  . Chlorhexidine Gluconate Cloth 2 % PADS 6 each  6 each Topical Daily Clydell Hakim, MD   6 each at 02/14/19 1146  . feeding supplement (PRO-STAT SUGAR FREE 64) liquid 60 mL  60 mL Per Tube BID Collene Gobble, MD   60 mL at 02/14/19 1003  . feeding supplement (VITAL AF 1.2 CAL) liquid 1,000 mL  1,000 mL Per Tube Continuous Collene Gobble, MD 50 mL/hr at 02/14/19 1224 1,000 mL at 02/14/19 1224  . fentaNYL (SUBLIMAZE) injection 50 mcg  50 mcg Intravenous Q15 min PRN [name] B, MD      . fentaNYL (SUBLIMAZE) injection 50-200 mcg  50-200 mcg Intravenous Q30 min PRN [name] B, MD   100 mcg at 02/14/19  0435  . heparin injection 1,000-6,000 Units  1,000-6,000 Units CRRT PRN Roney Jaffe, MD   2,400 Units at 02/13/19 1404  . heparin injection 5,000 Units  5,000 Units Subcutaneous Q8H Mannam, Praveen, MD   5,000 Units at 02/14/19 0604  . lactulose (CHRONULAC) 10 GM/15ML solution 10 g  10 g Per Tube BID Brand Males, MD   10 g at 02/14/19 1004  . levETIRAcetam (KEPPRA) 100 MG/ML solution 500 mg  500 mg Per Tube BID Consuella Lose, MD   500 mg at 02/14/19 1004  . MEDLINE mouth rinse  15 mL Mouth Rinse 10 times per day Minor, Grace Bushy, NP   15 mL at 02/14/19 1146  . midazolam (VERSED) injection 2 mg  2 mg Intravenous Q2H PRN Juanito Doom, MD   2 mg at 02/13/19 1816  . norepinephrine (LEVOPHED) 16 mg in 248m premix infusion  0-40 mcg/min Intravenous Titrated DMauricia Area MD 24.4 mL/hr at 02/14/19 1300 26 mcg/min at 02/14/19 1300  . nystatin (  MYCOSTATIN/NYSTOP) topical powder   Topical TID Eustace Moore, MD      . ondansetron [facility] tablet 4 mg  4 mg Oral Q4H PRN Eustace Moore, MD       Or  . ondansetron [facility]) injection 4 mg  4 mg Intravenous Q4H PRN Eustace Moore, MD      . pantoprazole sodium (PROTONIX) 40 mg/20 mL oral suspension 40 mg  40 mg Per Tube Q1200 Rigoberto Noel, MD   40 mg at 02/14/19 1221  . prismasol BGK 4/2.5 infusion   CRRT Continuous Roney Jaffe, MD 1,800 mL/hr at 02/14/19 1142    . sodium chloride 0.9 % primer fluid for CRRT   CRRT PRN Roney Jaffe, MD      . sodium chloride flush (NS) 0.9 % injection 10-40 mL  10-40 mL Intracatheter Q12H Brand Males, MD   10 mL at 02/14/19 1005  . sodium chloride flush (NS) 0.9 % injection 10-40 mL  10-40 mL Intracatheter PRN Brand Males, MD       OBJECTIVE: Vitals:   02/14/19 1230 02/14/19 1300  BP: (!) 112/43 (!) 111/58  Pulse: 85 85  Resp: (!) 38 (!) 36  Temp:    SpO2: 93% 93%   Body mass index is 36.26 kg/m. ECOG FS:4 - Bedbound Head: Left temporal staples intact Ocular: Sclerae  icteric, pupils equal, round and reactive to light Ear-nose-throat: ET tube in place Lymphatic: No lymphadenopathy Lungs tachypneic, coarse rhonchi Heart tachycardic, no murmur, trace lower extremity edema Abd soft, nontender, positive bowel sounds Skin: Severely jaundiced, no rashes Neuro: Does not respond to pain or voice.  Does not follow commands.  LAB RESULTS: CMP     Component Value Date/Time   NA 135 02/14/2019 0334   K 4.7 02/14/2019 0334   CL 100 02/14/2019 0334   CO2 21 (L) 02/14/2019 0334   GLUCOSE 126 (H) 02/14/2019 0334   BUN 27 (H) 02/14/2019 0334   CREATININE 0.96 02/14/2019 0334   CALCIUM 8.5 (L) 02/14/2019 0334   PROT 5.7 (L) 02/14/2019 0334   ALBUMIN 1.6 (L) 02/14/2019 0334   AST 382 (H) 02/14/2019 0334   ALT 117 (H) 02/14/2019 0334   ALKPHOS 620 (H) 02/14/2019 0334   BILITOT 35.8 (HH) 02/14/2019 0334   GFRNONAA >60 02/14/2019 0334   GFRAA >60 02/14/2019 0334   INo results found for: SPEP, UPEP Lab Results  Component Value Date   WBC 19.3 (H) 02/14/2019   NEUTROABS 16.1 (H) 02/14/2019   HGB 8.8 (L) 02/14/2019   HCT 27.1 (L) 02/14/2019   MCV 101.5 (H) 02/14/2019   PLT 58 (L) 02/14/2019   '@LASTCHEMISTRY' @ No results found for: LABCA2 No components found for: LABCA125 Recent Labs  Lab 02/13/19 0500  INR 1.1   Urinalysis    Component Value Date/Time   COLORURINE AMBER (A) 02/11/2019 1109   APPEARANCEUR CLOUDY (A) 02/11/2019 1109   LABSPEC 1.012 02/11/2019 1109   PHURINE 5.0 02/11/2019 1109   GLUCOSEU NEGATIVE 02/11/2019 1109   HGBUR LARGE (A) 02/11/2019 1109   BILIRUBINUR MODERATE (A) 02/11/2019 1109   KETONESUR NEGATIVE 02/11/2019 1109   PROTEINUR 30 (A) 02/11/2019 1109   NITRITE NEGATIVE 02/11/2019 1109   LEUKOCYTESUR LARGE (A) 02/11/2019 1109   STUDIES: Ct Head Wo Contrast  Result Date: 02/10/2019 CLINICAL DATA:  Follow-up examination for subdural hemorrhage. EXAM: CT HEAD WITHOUT CONTRAST TECHNIQUE: Contiguous axial images were  obtained from the base of the skull through the vertex without intravenous contrast. COMPARISON:  Prior CT from 02/04/2019. FINDINGS: Brain: Postoperative changes from left-sided craniotomy for subdural evacuation again seen. Residual left extra-axial hematoma overlying the subjacent left cerebral convexity measures up to 8 mm in maximal thickness, improved from previous. Small amount of subdural blood seen along the posterior falx. Small amount of residual extra-axial pneumocephalus, markedly decreased from prior. Underlying intraparenchymal hematoma centered at the left temporal lobe has mildly dispersed as compared to previous exam, now measuring slightly larger in size at 3.5 x 4.2 x 2.1 cm (previously 3.1 x 4.2 x 2.2 cm when measured at similar dimensions. Surrounding low-density vasogenic edema has slightly increased. Adjacent small volume subarachnoid hemorrhage, similar. Persistent 4 mm left-to-right shift, unchanged. Overall ventricular size and morphology relatively unchanged without hydrocephalus or progressive ventricular trapping. Basilar cisterns remain patent. Evolving cytotoxic edema seen involving the parasagittal left occipital lobe (series 2, image 13), compatible with evolving left PCA territory infarct, more prominent as compared to previous exam. No associated hemorrhage or mass effect. No other new acute intracranial hemorrhage. No other acute large vessel territory infarct. No mass lesion. Vascular: No hyperdense vessel. Skull: Postoperative changes from prior left craniotomy. No adverse features. Sinuses/Orbits: Globes and orbital soft tissues demonstrate no acute finding. Paranasal sinuses are largely clear. Chronic left orbital floor fracture noted. Nasogastric tube in place. Trace bilateral mastoid effusions. Other: None. IMPRESSION: 1. Slight interval dispersion of left temporal lobe intraparenchymal hematoma, now measuring slightly increased in size at 3.5 x 4.2 x 2.1 cm (previously 3.1  x 4.2 x 2.2 cm). Surrounding vasogenic edema has slightly increased. Associated 4 mm left-to-right midline shift unchanged. 2. Interval decrease in size of left extra-axial hemorrhage status post left craniotomy for subdural evacuation, now measuring up to 8 mm in maximal thickness. 3. Evolving acute left PCA territory infarct. No associated hemorrhage or mass effect. 4. No other new acute intracranial process. Electronically Signed   By: Jeannine Boga M.D.   On: 02/10/2019 04:24   Ct Head Wo Contrast  Result Date: 02/04/2019 CLINICAL DATA:  Intracranial hemorrhage follow up EXAM: CT HEAD WITHOUT CONTRAST TECHNIQUE: Contiguous axial images were obtained from the base of the skull through the vertex without intravenous contrast. COMPARISON:  02/17/2019 FINDINGS: Brain: There is new, acute intraparenchymal hemorrhage within the left temporal lobe, measuring 3.4 x 2.8 x 2.5 cm (volume = 12 cm^3). There is small amount of overlying subarachnoid blood. There is moderate edema within the left temporal lobe. Patient is status post evacuation of a left-sided subdural hematoma. There is a subdural drainage catheter along the left convexity. Midline shift has substantially resolved. No hydrocephalus. Vascular: No abnormal hyperdensity of the major intracranial arteries or dural venous sinuses. No intracranial atherosclerosis. Skull: Status post left parietal craniotomy. Sinuses/Orbits: No fluid levels or advanced mucosal thickening of the visualized paranasal sinuses. No mastoid or middle ear effusion. The orbits are normal. IMPRESSION: 1. Acute intraparenchymal hematoma within the left temporal lobe with volume of 12 mL and moderate surrounding edema. Small amount of overlying subarachnoid blood. 2. Status post evacuation of left hemispheric subdural hematoma with resolution of midline shift. Critical Value/emergent results were called by telephone at the time of interpretation on 02/04/2019 at 5:52 am to Ezra Sites, NP , who verbally acknowledged these results. Electronically Signed   By: Ulyses Jarred M.D.   On: 02/04/2019 05:52   Ct Head Wo Contrast  Result Date: 02/10/2019 CLINICAL DATA:  Posturing. Subdural hematoma. Liver failure, likely coagulopathy. EXAM: CT HEAD WITHOUT CONTRAST TECHNIQUE: Contiguous axial images  were obtained from the base of the skull through the vertex without intravenous contrast. COMPARISON:  CT head 02/16/2019. FINDINGS: Brain: Acute LEFT subdural hematoma epicenter posterior frontotemporal convexity, is slightly larger compared with yesterday's scan, 28 mm. There is more central hyperattenuation suggesting interval acute bleeding. There is significant LEFT-to-RIGHT shift, 10-11 mm, now with early obstructive hydrocephalus of the RIGHT lateral ventricle. LEFT temporal horn is also more prominent, and partially trapped. Vascular: No hyperdense vessel or unexpected calcification. Skull: Normal. Negative for fracture or focal lesion. Sinuses/Orbits: No acute finding. Other: None. IMPRESSION: Worsening LEFT subdural hematoma, a few mm increased thickness compared to yesterday. Increasing central hyperattenuation suggesting further acute bleeding. LEFT-to-RIGHT shift of 10-11 mm, now with early obstructive hydrocephalus on the RIGHT and enlarging LEFT temporal horn. These results were communicated to Dr. Ronnald Ramp at 9:36 amon 06/04/2020by text page. Electronically Signed   By: Staci Righter M.D.   On: 02/07/2019 09:37   Ct Head Wo Contrast  Result Date: 02/19/2019 CLINICAL DATA:  Altered level of consciousness. EXAM: CT HEAD WITHOUT CONTRAST TECHNIQUE: Contiguous axial images were obtained from the base of the skull through the vertex without intravenous contrast. COMPARISON:  None. FINDINGS: Brain: There is a large acute left-sided subdural hematoma along the left frontoparietal convexity. It measures approximately 2.6 cm in its greatest dimension on the coronal view. There is effacement of  the left lateral ventricle with a 6 mm rightward midline shift. Vascular: No hyperdense vessel or unexpected calcification. Skull: Normal. Negative for fracture or focal lesion. Sinuses/Orbits: No acute finding. The patient is status post bilateral cataract surgery. Other: None. IMPRESSION: Large subdural hematoma along the left frontoparietal convexity measuring 2.6 cm in width and causing a 6 mm rightward midline shift. These results were called by telephone at the time of interpretation on 02/08/2019 at 6:53 pm to Dr. Theotis Burrow , who verbally acknowledged these results. Electronically Signed   By: Constance Holster M.D.   On: 02/22/2019 18:58   Ct Chest W Contrast  Result Date: 02/08/2019 CLINICAL DATA:  58 year old female with fall and abdominal pain. History of liver failure. EXAM: CT CHEST, ABDOMEN, AND PELVIS WITH CONTRAST TECHNIQUE: Multidetector CT imaging of the chest, abdomen and pelvis was performed following the standard protocol during bolus administration of intravenous contrast. CONTRAST:  168m OMNIPAQUE IOHEXOL 300 MG/ML  SOLN COMPARISON:  None. FINDINGS: CT CHEST FINDINGS Cardiovascular: There is no cardiomegaly or pericardial effusion. The thoracic aorta is unremarkable. The origins of the great vessels of the arch appear patent as visualized. The central pulmonary arteries are unremarkable. Mediastinum/Nodes: There is no hilar or mediastinal adenopathy. Esophagus and thyroid gland are grossly unremarkable. No mediastinal fluid collection. Lungs/Pleura: The lungs are clear. There is no pleural effusion or pneumothorax. The central airways are patent. Musculoskeletal: Mild age indeterminate, likely chronic compression deformity of the superior endplates of T2 and T3. No definite acute fracture. CT ABDOMEN PELVIS FINDINGS No intra-abdominal free air or free fluid. Hepatobiliary: Severe fatty infiltration of the liver. No intrahepatic biliary ductal dilatation. Probable sludge within the  gallbladder. No calcified stone. No pericholecystic fluid. Pancreas: Unremarkable. No pancreatic ductal dilatation or surrounding inflammatory changes. Spleen: Normal in size without focal abnormality. Adrenals/Urinary Tract: The adrenal glands, kidneys, visualized ureters, and urinary bladder appear unremarkable. Stomach/Bowel: There is mild thickened appearance of the ascending colon and hepatic flexure, likely artifactual. Mild colitis is not excluded. Clinical correlation is recommended. There is no bowel obstruction. The appendix is normal. Vascular/Lymphatic: The abdominal aorta and IVC  appear unremarkable. No portal venous gas. There is no adenopathy. Reproductive: Hysterectomy. No pelvic mass. Other: Diffuse subcutaneous edema. No fluid collection. Musculoskeletal: No acute or significant osseous findings. IMPRESSION: 1. No acute intrathoracic pathology. 2. Underdistention of the ascending colon versus less likely mild colitis. Clinical correlation is recommended. No bowel obstruction. Normal appendix. 3. Severe fatty infiltration of the liver. Electronically Signed   By: Anner Crete M.D.   On: 01/31/2019 22:20   Ct Abdomen Pelvis W Contrast  Result Date: 02/24/2019 CLINICAL DATA:  58 year old female with fall and abdominal pain. History of liver failure. EXAM: CT CHEST, ABDOMEN, AND PELVIS WITH CONTRAST TECHNIQUE: Multidetector CT imaging of the chest, abdomen and pelvis was performed following the standard protocol during bolus administration of intravenous contrast. CONTRAST:  167m OMNIPAQUE IOHEXOL 300 MG/ML  SOLN COMPARISON:  None. FINDINGS: CT CHEST FINDINGS Cardiovascular: There is no cardiomegaly or pericardial effusion. The thoracic aorta is unremarkable. The origins of the great vessels of the arch appear patent as visualized. The central pulmonary arteries are unremarkable. Mediastinum/Nodes: There is no hilar or mediastinal adenopathy. Esophagus and thyroid gland are grossly  unremarkable. No mediastinal fluid collection. Lungs/Pleura: The lungs are clear. There is no pleural effusion or pneumothorax. The central airways are patent. Musculoskeletal: Mild age indeterminate, likely chronic compression deformity of the superior endplates of T2 and T3. No definite acute fracture. CT ABDOMEN PELVIS FINDINGS No intra-abdominal free air or free fluid. Hepatobiliary: Severe fatty infiltration of the liver. No intrahepatic biliary ductal dilatation. Probable sludge within the gallbladder. No calcified stone. No pericholecystic fluid. Pancreas: Unremarkable. No pancreatic ductal dilatation or surrounding inflammatory changes. Spleen: Normal in size without focal abnormality. Adrenals/Urinary Tract: The adrenal glands, kidneys, visualized ureters, and urinary bladder appear unremarkable. Stomach/Bowel: There is mild thickened appearance of the ascending colon and hepatic flexure, likely artifactual. Mild colitis is not excluded. Clinical correlation is recommended. There is no bowel obstruction. The appendix is normal. Vascular/Lymphatic: The abdominal aorta and IVC appear unremarkable. No portal venous gas. There is no adenopathy. Reproductive: Hysterectomy. No pelvic mass. Other: Diffuse subcutaneous edema. No fluid collection. Musculoskeletal: No acute or significant osseous findings. IMPRESSION: 1. No acute intrathoracic pathology. 2. Underdistention of the ascending colon versus less likely mild colitis. Clinical correlation is recommended. No bowel obstruction. Normal appendix. 3. Severe fatty infiltration of the liver. Electronically Signed   By: AAnner CreteM.D.   On: 02/27/2019 22:20   Dg Chest Port 1 View  Result Date: 02/14/2019 CLINICAL DATA:  Acute respiratory failure. EXAM: PORTABLE CHEST 1 VIEW COMPARISON:  One-view chest x-ray 02/13/2019 FINDINGS: Endotracheal tube and bilateral IJ lines are stable. Feeding tube courses off the inferior border the film. Heart size is  exaggerated by low lung volumes. There is no significant change in bilateral infrahilar airspace opacities, right greater than left. IMPRESSION: 1. Stable infrahilar airspace opacities bilaterally, likely reflecting atelectasis. Infection is not excluded. 2. Support apparatus is stable. Electronically Signed   By: CSan MorelleM.D.   On: 02/14/2019 07:32   Dg Chest Port 1 View  Result Date: 02/13/2019 CLINICAL DATA:  Order for acute respiratory failure EXAM: PORTABLE CHEST 1 VIEW COMPARISON:  Chest x-rays dated 02/12/2019 and 02/11/2019. FINDINGS: Tubes and lines are stable in position. Stable cardiomegaly. Bibasilar opacities are unchanged. No pleural effusion or pneumothorax seen. IMPRESSION: Stable chest x-ray. Support apparatus is stable. Bibasilar opacities are unchanged, atelectasis versus pneumonia. Stable cardiomegaly. Electronically Signed   By: SFranki CabotM.D.   On: 02/13/2019 08:26  Dg Chest Port 1 View  Result Date: 02/12/2019 CLINICAL DATA:  Intubation. EXAM: PORTABLE CHEST 1 VIEW COMPARISON:  02/11/2019.  02/04/2019. FINDINGS: Interim extubation. Feeding tube and bilateral IJ lines in stable position. Stable cardiomegaly. Bilateral pulmonary interstitial prominence, progressed from prior exam. A component of CHF could present this fashion. Bibasilar atelectasis/infiltrates again noted. No pneumothorax. IMPRESSION: 1. Interim extubation. Feeding tube and bilateral IJ lines in stable position. 2. Stable cardiomegaly. Bilateral from interstitial prominence, progressed from prior exam. A component of CHF could present this fashion. 3.  Bibasilar atelectasis/infiltrates again Electronically Signed   By: Marcello Moores  Register   On: 02/12/2019 08:04   Dg Chest Port 1 View  Result Date: 02/11/2019 CLINICAL DATA:  Central line clotted EXAM: PORTABLE CHEST 1 VIEW COMPARISON:  Portable exam 1556 hours compared to 02/10/2019 FINDINGS: Tip of endotracheal tube projects 6.8 cm above carina.  Feeding tube extends into stomach. New RIGHT jugular central venous catheter with tip projecting over SVC. LEFT jugular line unchanged. Enlargement of cardiac silhouette. Mediastinal contours normal. Low lung volumes with bibasilar atelectasis. No acute infiltrate, pleural effusion or pneumothorax. IMPRESSION: No pneumothorax following RIGHT jugular line placement. Enlargement of cardiac silhouette with bibasilar atelectasis. Electronically Signed   By: Lavonia Dana M.D.   On: 02/11/2019 16:35   Dg Chest Port 1 View  Result Date: 02/10/2019 CLINICAL DATA:  58 year old female EXAM: PORTABLE CHEST 1 VIEW COMPARISON:  02/06/2019 FINDINGS: Left rotation somewhat limits evaluation. Low lung volumes with patchy opacities at the bilateral lung bases. Blunting of the left costophrenic angle. No pneumothorax.  Mild interlobular septal thickening. Endotracheal tube terminates at the carina, near the right mainstem bronchus. Withdrawal of 4-5 cm may position this better at the clavicular heads. Interval placement of left IJ central venous catheter. The tip projects over the aortic arch on this plain film, though there is left rotation of the patient. No pneumothorax. Feeding tube projects over the mediastinum and terminates in the abdomen out of the field of view. IMPRESSION: Endotracheal tube terminates at the carina directed towards the right mainstem. Withdrawal of 4-5 cm may position this more optimally. Left IJ central venous catheter has been placed with no pneumothorax. The tip cannot be located on this plain film. Enteric tube has been placed projecting over the mediastinum and terminating out of the field of view in the abdomen. These above results were discussed by telephone at the time of interpretation on 02/10/2019 at 2:12 pm with the nurse caring for the patient Ms Tammy. Low lung volumes with possible combination of atelectasis/consolidation, edema, and possible left pleural effusion. Electronically Signed    By: Corrie Mckusick D.O.   On: 02/10/2019 14:13   Dg Chest Port 1 View  Result Date: 02/06/2019 CLINICAL DATA:  Check endotracheal tube placement EXAM: PORTABLE CHEST 1 VIEW COMPARISON:  02/04/2019 FINDINGS: Cardiac shadow is stable. Endotracheal tube is noted approximately 1 cm above the carina. Gastric catheter is noted within the stomach. The lungs are clear with the exception of some platelike atelectasis in the bases bilaterally. IMPRESSION: Stable changes when compared with the prior exam. Electronically Signed   By: Inez Catalina M.D.   On: 02/06/2019 08:09   Dg Chest Port 1 View  Result Date: 02/04/2019 CLINICAL DATA:  Mechanical ventilation. EXAM: PORTABLE CHEST 1 VIEW COMPARISON:  02/18/2019 FINDINGS: Endotracheal tube is roughly 1.6 cm above the carina. Nasogastric tube extends into the abdomen but the tip is beyond the image. Again noted are a few densities at the  lung bases and most compatible with atelectasis. Upper lungs are clear. Negative for a pneumothorax. Heart size is upper limits of normal and stable. IMPRESSION: 1. Basilar atelectasis with minimal change. 2. Support apparatuses as described. Electronically Signed   By: Markus Daft M.D.   On: 02/04/2019 09:38   Dg Chest Portable 1 View  Result Date: 02/18/2019 CLINICAL DATA:  Hypoxia EXAM: PORTABLE CHEST 1 VIEW COMPARISON:  Chest radiograph and chest CT February 02, 2019 FINDINGS: Endotracheal tube tip is 1.8 cm above the carina. Nasogastric tube tip and side port are below the diaphragm. No pneumothorax. There is atelectasis in the left base. The lungs elsewhere are clear. Heart is upper normal in size with pulmonary vascularity normal. No adenopathy. No bone lesions. IMPRESSION: Tube positions as described without pneumothorax. Slight left base atelectasis. No edema or consolidation. Stable cardiac silhouette. Electronically Signed   By: Lowella Grip III M.D.   On: 02/05/2019 08:15   Dg Chest Port 1 View  Result Date:  02/21/2019 CLINICAL DATA:  Shortness of breath EXAM: PORTABLE CHEST 1 VIEW COMPARISON:  None. FINDINGS: There is focal airspace opacity in the medial right base. There is mild atelectatic change in the left base. Lungs elsewhere are clear. Heart is upper normal in size with pulmonary vascularity normal. No adenopathy. No bone lesions. IMPRESSION: Focal consolidation medial right base concerning for pneumonia. Left base atelectasis. Lungs elsewhere clear. Heart upper normal in size. Followup PA and lateral chest radiographs recommended in 3-4 weeks following trial of antibiotic therapy to ensure resolution and exclude underlying malignancy. Electronically Signed   By: Lowella Grip III M.D.   On: 02/06/2019 18:44   US Abdomen Limited Ruq  Result Date: 02/09/2019 CLINICAL DATA:  Abnormal transaminase level. EXAM: ULTRASOUND ABDOMEN LIMITED RIGHT UPPER QUADRANT COMPARISON:  Ultrasound of February 03, 2019. FINDINGS: Gallbladder: No gallstones or wall thickening visualized. No sonographic Murphy sign noted by sonographer. Sludge is noted within gallbladder lumen. Common bile duct: Diameter: 3 mm which is within normal limits. Liver: No focal lesion identified. Increased echogenicity of hepatic parenchyma is noted. Portal vein is patent on color Doppler imaging with normal direction of blood flow towards the liver. IMPRESSION: Sludge noted within gallbladder lumen.  Hepatic steatosis. Electronically Signed   By: Marijo Conception M.D.   On: 02/09/2019 09:49   US Abdomen Limited Ruq  Result Date: 02/25/2019 CLINICAL DATA:  58 year old with acute liver failure.  Jaundice. EXAM: ULTRASOUND ABDOMEN LIMITED RIGHT UPPER QUADRANT COMPARISON:  CT 02/27/2019 FINDINGS: Gallbladder: Echogenic sludge in the gallbladder. Concern for asymmetric gallbladder wall thickening but this apparent wall thickening could also be related to focal fat sparing in the adjacent liver. Reportedly, sonographic Murphy sign cannot be evaluated due to  patient's mental status. Common bile duct: Diameter: 0.3 cm Liver: No focal lesion. Diffusely increased echogenicity is suggestive for hepatic steatosis. Limited evaluation of the internal architecture due to the steatosis. No intrahepatic biliary dilatation. Portal vein is patent on color Doppler imaging with normal direction of blood flow towards the liver. IMPRESSION: 1. Hepatic steatosis.  No focal liver lesion. 2. Gallbladder sludge and questionable asymmetric gallbladder wall thickening. No biliary dilatation. Electronically Signed   By: Markus Daft M.D.   On: 02/11/2019 18:45   ASSESSMENT: 58 y.o. [place], [place] female with  (1) anemia    (2) thrombocytopenia    (3) acute liver failure   (4) acute kidney injury  PLAN: Samaia has anemia and thrombocytopenia in the setting of acute  liver failure and acute kidney injury.  Her anemia and thrombocytopenia are likely multifactorial. Her ADAMTS13 was low but difficult to interpret as it can be low with other conditions such as liver disease, DIC, and sepsis. ADAMTS13 is typically <10% with TTP. Will request additional workup including LDH, reticulocyte count, haptoglobin, and DIC panel. A peripheral smear has been requested for review.   Recommend continue supportive care with with transfusions.  Transfuse packed red blood cells for hemoglobin less than 7.0 or active bleeding.  Transfuse platelets for platelet count less than 20,000 or active bleeding. Further recommendation to follow pending additional lab studies.   Mikey Bussing, NP 02/14/2019 1:25 PM    ADDENDUM: 58 y/o [place] woman presenting 02/01/2019 with several weeks' nausea/vomiting and altered mental status, with a history of morbid obesity and DM, found to have a subdural hematoma and Na+ <115, also Hb of 11.2, WBC 11.1, ANC 9.4, platelets 188K, but creat 1.26 (no baseline), AST 173, ALT 56, alk phos 241 and t bil 31, direct 19.6. INR 1.4, albumin 2.2. Hep B/C negative. HIV and  COVID19 neg. Drug screen/ETOH/acetaminophen neg. ANA neg. CT abd shows severe fatty infiltration, no splenomegaly. ADAMTS 13 was 14%. W/U by GI shows no obvious cause for elevated bilirubin and we were consulted re possible hemolysis.  Review of blood film show occ targets, stomatocytes and spherocytes but no schistocytes; there are NRBCs but I do not see teardops; the WBC show left shift to the myelocyte level, no blasts; there are no platelet clumps.  The reticulocyte count is up  Results for JAIYLA, GRANADOS (MRN [id number]) as of 02/14/2019 19:16  Ref. Range 02/11/2019 09:29 02/14/2019 13:40  Retic Count, Absolute Latest Ref Range: 19.0 - 186.0 K/uL 180.8 214.5 (H)   As id the LDH .Results for DANTE, COOTER (MRN [id number]) as of 02/14/2019 19:16  Ref. Range 02/08/2019 05:15 02/14/2019 13:40  LDH Latest Ref Range: 98 - 192 U/L 468 (H) 615 (H)    consistent with hemolysis. However the haptoglobin is normal (143; repeat pending). DAT is pending, as are HIT and complement studies..  It is important to remember the ADAMTS protein is made in the liver and levels decrease with liver failure. In the absence of schistocytes and with an initially normal platelet count I do not believe we are dealing with TTP.  The retic/LDH/bil are c/w hemol;ysis. Haptoglobin is relatively spared in extravascular hemolysis. The peripheral blood picture described above is c/w this. In short the picture is one of liver failure complicated by extravascular hemolysis, likely in the liver. This explains the very high t bil level. It does not explain the liver failure.  Unfortunately this does not lead to a therapeutic option beyond the care you are already providing.  Will follow with you.  Chauncey Cruel, MD Medical Oncology and Hematology [facility] [address] [place], [place] [address] Tel. [phone]    Fax. [phone]

## 2019-02-14 NOTE — Progress Notes (Signed)
Updated pts Husband Renne Cornick.  Writer answered all questions.  Irven Baltimore, RN

## 2019-02-14 NOTE — Progress Notes (Signed)
Hypoglycemic Event  CBG: 45  Treatment: D50 25 mL (12.5 gm)  Symptoms: None  Follow-up CBG: Time:2004 CBG Result:79  Possible Reasons for Event: Unknown  Comments/MD notified:ELINK    Rulon Abide

## 2019-02-14 NOTE — Progress Notes (Signed)
Subjective: Interval History: Transferred to 54 M.  Objective: Vital signs in last 24 hours: Temp:  [97.4 F (36.3 C)-98.7 F (37.1 C)] 98.4 F (36.9 C) (06/17 0800) Pulse Rate:  [71-94] 88 (06/17 0900) Resp:  [20-38] 35 (06/17 0900) BP: (63-137)/(25-113) 132/44 (06/17 0900) SpO2:  [93 %-99 %] 95 % (06/17 0900) Arterial Line BP: (117-250)/(46-245) 250/245 (06/16 1105) FiO2 (%):  [30 %] 30 % (06/17 0738) Weight:  [101.9 kg] 101.9 kg (06/17 0325) Weight change: -1.2 kg  Intake/Output from previous day: 06/16 0701 - 06/17 0700 In: 2172.9 [I.V.:542.9; NG/GT:1250; IV Piggyback:200] Out: 3667 [Urine:3] Intake/Output this shift: Total I/O In: 173.6 [I.V.:73.6; NG/GT:100] Out: 315 [Other:315]  General appearance: icteric, moderately obese and not responsive Neck: IJ cath Resp: rhonchi bilaterally Cardio: S1, S2 normal GI: pos bs, obese,soft Extremities: edema 3+  Lab Results: Recent Labs    02/13/19 0500 02/14/19 0334  WBC 10.0 19.3*  HGB 8.2* 8.8*  HCT 24.8* 27.1*  PLT 50* 58*   BMET:  Recent Labs    02/13/19 1520 02/14/19 0334  NA 136 135  K 4.3 4.7  CL 101 100  CO2 24 21*  GLUCOSE 117* 126*  BUN 35* 27*  CREATININE 1.20* 0.96  CALCIUM 8.7* 8.5*   No results for input(s): PTH in the last 72 hours. Iron Studies: No results for input(s): IRON, TIBC, TRANSFERRIN, FERRITIN in the last 72 hours.  Studies/Results: Dg Chest Port 1 View  Result Date: 02/14/2019 CLINICAL DATA:  Acute respiratory failure. EXAM: PORTABLE CHEST 1 VIEW COMPARISON:  One-view chest x-ray 02/13/2019 FINDINGS: Endotracheal tube and bilateral IJ lines are stable. Feeding tube courses off the inferior border the film. Heart size is exaggerated by low lung volumes. There is no significant change in bilateral infrahilar airspace opacities, right greater than left. IMPRESSION: 1. Stable infrahilar airspace opacities bilaterally, likely reflecting atelectasis. Infection is not excluded. 2. Support  apparatus is stable. Electronically Signed   By: San Morelle M.D.   On: 02/14/2019 07:32   Dg Chest Port 1 View  Result Date: 02/13/2019 CLINICAL DATA:  Order for acute respiratory failure EXAM: PORTABLE CHEST 1 VIEW COMPARISON:  Chest x-rays dated 02/12/2019 and 02/11/2019. FINDINGS: Tubes and lines are stable in position. Stable cardiomegaly. Bibasilar opacities are unchanged. No pleural effusion or pneumothorax seen. IMPRESSION: Stable chest x-ray. Support apparatus is stable. Bibasilar opacities are unchanged, atelectasis versus pneumonia. Stable cardiomegaly. Electronically Signed   By: Franki Cabot M.D.   On: 02/13/2019 08:26    I have reviewed the patient's current medications.  Assessment/Plan: 1 AKI oliguric ATN.  Vol xs good solute/acid/base/k control.  Needs lower vol 2 VDRF per CCM 3 Acute liver injury prob drug 4 Obesity 5 DM 6 pressors bps ok 87 Anemia stable P CRRT, lower vol,  Vent,    LOS: 11 days   Taylor Robinson 02/14/2019,9:58 AM

## 2019-02-14 NOTE — Progress Notes (Signed)
NAME:  Taylor Robinson, MRN:  [id number], DOB:  01/24/1961, LOS: 11 ADMISSION DATE:  02/25/2019, CONSULTATION DATE:  02/01/2019 REFERRING MD:  Clarene DukeLittle, CHIEF COMPLAINT:  Confusion   Brief History   58 y/o female admitted form home with jaundice, nausea and vomiting which developed over weeks, eventually developed confusion and found to have a subdural hemorrhage.  Required hematoma evacuation by neurosurgery on 6/6.    Past Medical History  Hypothyroidism, DM2, GERD, obestiy  Significant Hospital Events   6/5 - smear review - normocytic anemia 6/6 craniotomy - [facility] decision to [facility] vit k and [facility]  6/7  GI consult - Etiology of hyperbilirubinemia is unclear; multiple serologies are pending - "  Profound hyperbilirubinemia with much less elevated other liver function tests.  I am not sure this is liver failure, but assessment under current patient condition (intubation, minimally arousable) is difficult.  Relative normal INR argues at least against progressive liver failure.  No evidence of biliary obstruction".   6/10 - GI consult - supportive care. No role for steroids, Serologies negative. Liver bx risk felt to outweigh benefits - "It is felt this is most likely a drug reaction, superimposed likely on chronic hepatic steatosis, but certainly hemolysis from intracranial bleeding could lead to an enhanced degree of hyperbilirubinemia.". GI d/w [facility] Dr Sharlene MottsBerg - not felt to be a transplant candidate because INR normal and not felt to be liver failure + recent craniotomy  6/11 extubated  6/13 - reintubated and central line - WBC elevated, INR OK, Bili higher today, Mental status worse. Ammonia level 107   6/14 - remain intubated on vent. On levophed. CVP 18. Creat up at 4.25 (admit 1.26). K 5.1, bic 17. Plat dropped to 82. INR 1.2 bili 34 and worse (per RN - bed bound for few months prior to admission - per daughter related to concussion). On daily albumin. Goal BP - MAP >  80 . Makes 15-20cc/h urine   Consults:  NSGY GI  Procedures:  6/6 ETT > 6/11, 6/13 >> 6/6 L Craniotomy for subdural hematoma CVC 6/13 >> HD cath 6/14 >>   Significant Diagnostic Tests:  6/5 CTH >>Large subdural hematoma along the left frontoparietal convexity measuring 2.6 cm in width and causing a 6 mm rightward midline shift. 6/5 CT abd/pelvis + 6/5  CT chest: No acute intrathoracic pathology. Underdistention of the ascending colon versus less likely mild Colitis. Severe fatty infiltration of the liver. 6/6 RUQ ultrasound > steatosis, gludge CT head 6/7  reveals improvement in shift after evacuation, new focus of bleeding posterior temporal.  6/12 RUQ ultrasound > sludge in gallbladder lumen, hepatic steatosis  Micro Data:  6/5 SARS coronavirus 2 >> negative 6/5 BCx 2 >>  negative 6/6 BCx >> negative 6/5 UC >> insignificant growth 6/15 UC >> 100 K cfu  Pseudomonas aeruginosa >> pansensitive  Antimicrobials:  Cefazolin 6/6 x1 Cefepime 6/16 >>   Interim history/subjective:  Remains on CVVH and attempting volume removal, requiring norepinephrine, currently at 20 Cefepime started yesterday 6/16  Objective   Blood pressure (!) 111/56, pulse 84, temperature 97.7 F (36.5 C), temperature source Oral, resp. rate (!) 34, height 5\' 6"  (1.676 m), weight 101.9 kg, SpO2 91 %. CVP:  [7 mmHg] 7 mmHg  Vent Mode: PRVC FiO2 (%):  [30 %] 30 % Set Rate:  [15 bmp-18 bmp] 18 bmp Vt Set:  [470 mL] 470 mL PEEP:  [5 cmH20] 5 cmH20   Intake/Output  Summary (Last 24 hours) at 02/14/2019 1203 Last data filed at 02/14/2019 1200 Gross per 24 hour  Intake 2161.92 ml  Output 3293 ml  Net -1131.08 ml   Filed Weights   02/12/19 0400 02/13/19 0500 02/14/19 0325  Weight: 108.6 kg 103.1 kg 101.9 kg   General Appearance: Chronically ill-appearing woman, ventilated Head: Normocephalic, left temporal staples in place, Eyes: Pupils equal, severe scleral icterus, mild scleral edema Throat: ET tube,  OG tube both in place, no significant secretions Neck: Left IJ CVC, right IJ HD catheter, no lymphadenopathy Lungs: Coarse bilateral breath sounds, few scattered inspiratory crackles, no wheezes Heart: Regular, borderline tachycardic, no murmur, norepinephrine 20 Abdomen: Soft, obese, nondistended with positive bowel sounds Extremities: Trace pretibial edema Skin: Severely jaundiced, no rashes Neurologic: Poorly responsive even with minimal sedation.  Does not respond to pain, voice.  Does not follow commands   LABS    PULMONARY Recent Labs  Lab 02/10/19 1236 02/11/19 1148 02/13/19 0420 02/14/19 0338  PHART 7.262* 7.418 7.518* 7.482*  PCO2ART 46.4 30.8* 30.8* 29.9*  PO2ART 110.0* 159.0* 79.0* 58.9*  HCO3 20.9 19.9* 25.1 22.1  TCO2 22 21* 26  --   O2SAT 97.0 99.0 97.0 90.1    CBC Recent Labs  Lab 02/12/19 0500 02/13/19 0420 02/13/19 0500 02/14/19 0334  HGB 7.9* 8.2* 8.2* 8.8*  HCT 24.0* 24.0* 24.8* 27.1*  WBC 14.0*  --  10.0 19.3*  PLT 61*  --  50* 58*    COAGULATION Recent Labs  Lab 02/10/19 0613 02/11/19 0445 02/12/19 0500 02/13/19 0500  INR 1.3* 1.2 1.2 1.1    CARDIAC  No results for input(s): TROPONINI in the last 168 hours. No results for input(s): PROBNP in the last 168 hours.   CHEMISTRY Recent Labs  Lab 02/10/19 1741 02/11/19 0445  02/12/19 0500 02/12/19 1920 02/13/19 0420 02/13/19 0500 02/13/19 1520 02/14/19 0334  NA  --  135   < > 137 136 137 136 136 135  K  --  5.1   < > 4.3 4.2 4.2 4.1 4.3 4.7  CL  --  104  --  103 101  --  101 101 100  CO2  --  17*  --  21* 23  --  23 24 21*  GLUCOSE  --  122*  --  136* 105*  --  131* 117* 126*  BUN  --  115*  --  80* 50*  --  38* 35* 27*  CREATININE  --  4.25*  --  2.63* 1.67*  --  1.19* 1.20* 0.96  CALCIUM  --  8.3*  --  8.4* 8.5*  --  8.6* 8.7* 8.5*  MG 2.3 2.3  --  2.4  --   --  2.4  --  2.5*  PHOS 4.2 4.2  --  4.4 3.2  --  3.1 3.4 3.5   < > = values in this interval not displayed.    Estimated Creatinine Clearance: 78.8 mL/min (by C-G formula based on SCr of 0.96 mg/dL).   LIVER Recent Labs  Lab 02/10/19 0613 02/11/19 0445 02/12/19 0500 02/12/19 1920 02/13/19 0500 02/13/19 1520 02/14/19 0334  AST 279* 232* 246*  --  310*  --  382*  ALT 69* 67* 74*  --  91*  --  117*  ALKPHOS 399* 433* 402*  --  504*  --  620*  BILITOT 33.8* 34.2* 34.4*  --  32.6*  --  35.8*  PROT 4.9* 5.2* 5.5*  --  5.4*  --  5.7*  ALBUMIN 1.6* 1.7* 2.0*  2.0* 1.8* 1.8*  1.8* 1.6* 1.6*  INR 1.3* 1.2 1.2  --  1.1  --   --      INFECTIOUS Recent Labs  Lab 02/12/19 0010 02/12/19 0500 02/13/19 0500  LATICACIDVEN 1.6  --  2.3*  PROCALCITON  --  4.52  --      ENDOCRINE CBG (last 3)  Recent Labs    02/14/19 0417 02/14/19 0802 02/14/19 1153  GLUCAP 96 115* 107*     Resolved Hospital Problem list     Assessment & Plan:  ASSESSMENT / PLAN:  PULMONARY A:  Acute resp failure at admit 02/22/2019 due to Subdural but reintubated 6/13 due to encephalopathy P:   Continue PRVC 8 cc/kg Continue pressure support as she can tolerate.  She does not have the mental status for extubation at this time  NEUROLOGIC A:   Acute encepahlopathy -multifactorial encephalopathy, presumed meds and acute renal failure superimposed on hepatic encephalopathy, and SDH P:   Continue to minimize sedating medications Continue lactulose as ordered, intermittently follow ammonia Appreciate neurosurgery assistance and management  VASCULAR A:    Circulatory shock, patient hypovolemic + septic, possibly superimposed residual sedating medication P:  Continue norepinephrine, change MAP goal to 60, wean as able Antibiotics initiated as below  INFECTIOUS A:   Pseudomonas urinary tract infection Hepatitis panel, HIV, CMV negative P:   Cefepime started 6/16  RENAL A:  Acute renal failure, unclear precipitating cause, presumed ATN.  Urine eosinophils negative P:  Appreciate nephrology assistance.   CVVH running.  Family understands that we are supporting fully in order to see if there is a reversible cause to her overall decompensation, respiratory failure, hepatic failure, renal failure.  He indicated that he does not believe the patient would want prolonged hemodialysis support.  GASTROINTESTINAL A:   Jaundice of unclear etiology.  Hepatic evaluation shows steatosis but overall does not support fulminant hepatic failure.  Note CT abdomen 6/5, right upper quadrant ultrasound was done on 6/6 and also 6/12.  Question hemolysis as cause, hepatic congestion? Transaminases rising as of 6/17, INR stable Severe hyperbilirubinemia, stable at 34 Ammonia peak 107 > 80 >  ADAMTS 13 activity > 15% (very low) P:   Continue lactulose, follow ammonia Appreciate gastroenterology assistance.  In absence of any evidence for fulminant failure they are following at a distance.  There is now some increase in her transaminases.  Question medication reaction?  Will check with pharmacy on medications, rule out potential for hepatic injury > note cefepime started 6/16.  Unfortunately most of the antibiotics to which Pseudomonas is sensitive can cause elevated LFTs Schistocytes were initially negative but ADAMTS severley decreased, suggests TTP or HUS. Will resend DIC panel, shigella. If we believe that this could be TTP then will need to consider plasmaphoresis, discuss w nephrology Check portal venous doppler US to r/o   HEMATOLOGIC A:  Anemia, question hemolysis Thromboctyopenia, initial smear did not support TTP, but now question ADAMTS-13 activity as above P:  DIC panel now ? Need to consider plasma   ENDOCRINE A:   At risk hyper/hypoglycemia   P:   SSI as ordered   MSK/DERM Groin and inframmamary - moisture related and painful STage 2 sacral decub v moisture related -   - both pre-admit P  - dressing     Best practice:  Diet: start tube feeding after intubation Pain/Anxiety/Delirium  protocol (if indicated): yes, prn fentanyl, versed, RASS target 0 to -1 VAP  protocol (if indicated): yes DVT prophylaxis: SCD GI prophylaxis: PPI Glucose control: SSI Mobility: bed rest Code Status: full  Family Communication:   6/6 - PCCM - called and spoke with the patient's daughter Trinna Postlex, telephone #[phone]- [phone].  She also has a younger brother who is 56 years old.  They are currently making medical decisions for the patient.  Upon further discussion with her she does state that her mother is married.  They are separated but not legally divorced.  Her husband is at [employer] who currently [place] in [place].  And the daughter has attempted to reach out to him this morning but there has been no answer.  His telephone number is [phone].  I have attempted to call the number provided by his daughter to reach [employer] and there was no answer.  6/7 - husband updated  6/13  - updated her husband Harvie HeckRandy on 6/13, they are separated.  He really doesn't have much clue about her prognosis.  I let him know that I think it is terrible and she is not a liver transplant candidate.  He voiced understanding.  He says her adult children are not really in a place to make decisions about her condition.  He recommended we proceed with intubaiton and supportive care and he would start talking to her kids about goals of care.  6/14 -  PCCM MD called Daughter Docia Chucklex Carey [phone]) = aware patient back on vent. Reports she has a brother who is [phone] and apparently "knows everything". Daughter says she prefers first call is to the separated husband / step dad and he updates kids but ok to call her as well esp if we cannot get hold of him.  Updated - daughter is agreeable to DNR if arrest but still full medical care but wants to talk to step dad/separated husband. For now see outcome of TTP workup and renal consult . Later husband called and confirmed to RN NO CPR but full medical care. Cal husband first  6/15  -- Updated husband Harvie HeckRandy by phone today  Disposition: remain in ICU  Independent CC time 60 minutes   Levy Pupaobert Dionis Autry, MD, PhD 02/14/2019, 12:03 PM [facility] [phone] or if no answer [phone]

## 2019-02-14 NOTE — Progress Notes (Signed)
Pharmacy Heparin Induced Thrombocytopenia (HIT) Note:  Taylor Robinson is an 58 y.o. female being evaluated for HIT. Heparin was started DVT prophylaxis and for CRRT (starting on 6/16) with platelet level of 50, and baseline platelets were 188 on 6/5.   HIT labs were ordered on 6/17.  Auto-populate labs: No results found for: HEPINDPLTAB, SRALOWDOSEHP, SRAHIGHDOSEH   CALCULATE SCORE:  4Ts (see the HIT Algorithm) Score  Thrombocytopenia 1 (doesn't fit either category score 2 or 0)  Timing 1   Thrombosis 0  Other causes of thrombocytopenia 0  Total 2     Recommendations:  A. No lab results available (HIT antibody and/or SRA ordered): -Low HIT probability: consider discontinuing HIT labs, continue heparin/LMWH, SRA not recommended; no heparin allergy documentation needed  Plan (Discussed with provider) Labs ordered: -Heparin antibody Heparin allergy: -no allergy documentation needed Anticoagulation plans -continue heparin / [place], PharmD, [place] Clinical Pharmacist  Pager: [phone] Phone: [phone] 02/14/2019, 7:41 PM

## 2019-02-14 NOTE — Progress Notes (Signed)
PCCM progress Note  I spoke with pt's husband Louie Casa, reviewed her status, plans with him.   Baltazar Apo, MD, PhD 02/14/2019, 2:35 PM [facility] [phone] or if no answer [phone]

## 2019-02-14 NOTE — Progress Notes (Signed)
eLink Physician-Brief Progress Note Patient Name: Taylor Robinson DOB: [birth year] MRN: [id number]   Date of Service  02/14/2019  HPI/Events of Note  Hypotension on 40 mic/ming of levophed and on CRRT, HR in 60s  eICU Interventions  DNR but ok for max pressors Add vasopressin and start epinephrine if needed Very poor prognosis     Intervention Category Major Interventions: Shock - evaluation and management  Margaretmary Lombard 02/14/2019, 8:36 PM

## 2019-02-14 NOTE — Progress Notes (Signed)
[facility] Progress Note Patient Name: [name] DOB: Nov 09, 1960 MRN: [id number]   Date of Service  02/14/2019  HPI/Events of Note  Severe hypoglycemia while on tube feeds  eICU Interventions  Initiate d10 Check glucose every 2 hours for now     Intervention Category Intermediate Interventions: Other:  Taylor Robinson 02/14/2019, 11:37 PM

## 2019-02-15 LAB — CBC WITH DIFFERENTIAL/PLATELET
Abs Immature Granulocytes: 1.75 10*3/uL — ABNORMAL HIGH (ref 0.00–0.07)
Basophils Absolute: 0.1 10*3/uL (ref 0.0–0.1)
Basophils Relative: 1 %
Eosinophils Absolute: 0.2 10*3/uL (ref 0.0–0.5)
Eosinophils Relative: 1 %
HCT: 32 % — ABNORMAL LOW (ref 36.0–46.0)
Hemoglobin: 9.3 g/dL — ABNORMAL LOW (ref 12.0–15.0)
Immature Granulocytes: 7 %
Lymphocytes Relative: 14 %
Lymphs Abs: 3.2 10*3/uL (ref 0.7–4.0)
MCH: 33 pg (ref 26.0–34.0)
MCHC: 29.1 g/dL — ABNORMAL LOW (ref 30.0–36.0)
MCV: 113.5 fL — ABNORMAL HIGH (ref 80.0–100.0)
Monocytes Absolute: 1.3 10*3/uL — ABNORMAL HIGH (ref 0.1–1.0)
Monocytes Relative: 6 %
Neutro Abs: 17 10*3/uL — ABNORMAL HIGH (ref 1.7–7.7)
Neutrophils Relative %: 71 %
Platelets: 30 10*3/uL — ABNORMAL LOW (ref 150–400)
RBC: 2.82 MIL/uL — ABNORMAL LOW (ref 3.87–5.11)
RDW: 20.2 % — ABNORMAL HIGH (ref 11.5–15.5)
WBC: 24 10*3/uL — ABNORMAL HIGH (ref 4.0–10.5)
nRBC: 15.2 % — ABNORMAL HIGH (ref 0.0–0.2)

## 2019-02-15 LAB — GASTROINTESTINAL PANEL BY PCR, STOOL (REPLACES STOOL CULTURE)

## 2019-02-15 LAB — RENAL FUNCTION PANEL
Albumin: 1.6 g/dL — ABNORMAL LOW (ref 3.5–5.0)
Anion gap: 22 — ABNORMAL HIGH (ref 5–15)
BUN: 14 mg/dL (ref 6–20)
CO2: 11 mmol/L — ABNORMAL LOW (ref 22–32)
Calcium: 8.1 mg/dL — ABNORMAL LOW (ref 8.9–10.3)
Chloride: 101 mmol/L (ref 98–111)
Creatinine, Ser: 0.74 mg/dL (ref 0.44–1.00)
GFR calc Af Amer: >60 mL/min (ref >60)
GFR calc non Af Amer: >60 mL/min (ref >60)
Glucose, Bld: 75 mg/dL (ref 70–99)
Phosphorus: 7 mg/dL — ABNORMAL HIGH (ref 2.5–4.6)
Potassium: 5.9 mmol/L — ABNORMAL HIGH (ref 3.5–5.1)
Sodium: 134 mmol/L — ABNORMAL LOW (ref 135–145)

## 2019-02-15 LAB — GLUCOSE, CAPILLARY
Glucose-Capillary: 43 mg/dL — CL (ref 70–99)
Glucose-Capillary: 69 mg/dL — ABNORMAL LOW (ref 70–99)
Glucose-Capillary: 147 mg/dL — ABNORMAL HIGH (ref 70–99)
Glucose-Capillary: 123 mg/dL — ABNORMAL HIGH (ref 70–99)

## 2019-02-15 LAB — HEPATIC FUNCTION PANEL
ALT: 247 U/L — ABNORMAL HIGH (ref 0–44)
AST: 1189 U/L — ABNORMAL HIGH (ref 15–41)
Albumin: 1.6 g/dL — ABNORMAL LOW (ref 3.5–5.0)
Alkaline Phosphatase: 694 U/L — ABNORMAL HIGH (ref 38–126)
Bilirubin, Direct: 24.2 mg/dL — ABNORMAL HIGH (ref 0.0–0.2)
Total Bilirubin: 35.3 mg/dL (ref 0.3–1.2)
Total Protein: 5.9 g/dL — ABNORMAL LOW (ref 6.5–8.1)

## 2019-02-15 LAB — HAPTOGLOBIN: Haptoglobin: 97 mg/dL (ref 33–346)

## 2019-02-15 LAB — VITAMIN D 25 HYDROXY (VIT D DEFICIENCY, FRACTURES): Vit D, 25-Hydroxy: 86.4 ng/mL (ref 30.0–100.0)

## 2019-02-15 LAB — C3 COMPLEMENT: C3 Complement: 151 mg/dL (ref 82–167)

## 2019-02-15 LAB — PATHOLOGIST SMEAR REVIEW: Path Review: 6182020

## 2019-02-15 LAB — AMMONIA: Ammonia: 106 umol/L — ABNORMAL HIGH (ref 9–35)

## 2019-02-15 LAB — C4 COMPLEMENT: Complement C4, Body Fluid: 20 mg/dL (ref 14–44)

## 2019-02-15 LAB — MAGNESIUM: Magnesium: 2.9 mg/dL — ABNORMAL HIGH (ref 1.7–2.4)

## 2019-02-15 MED ORDER — STERILE WATER FOR INJECTION IV SOLN
INTRAVENOUS | Status: DC
  Administered 2019-02-15: 10:00:00 via INTRAVENOUS_CENTRAL
  Filled 2019-02-15 (×4): qty 150

## 2019-02-15 MED ORDER — PRISMASOL BGK 0/2.5 32-2.5 MEQ/L REPLACEMENT SOLN
Status: DC
  Filled 2019-02-15 (×4): qty 5000

## 2019-02-15 MED ORDER — PRISMASOL BGK 0/2.5 32-2.5 MEQ/L IV SOLN
INTRAVENOUS | Status: DC
  Filled 2019-02-15 (×8): qty 5000

## 2019-02-15 MED ORDER — DEXTROSE 50 % IV SOLN
INTRAVENOUS | Status: AC
  Administered 2019-02-15: 50 mL
  Filled 2019-02-15: qty 50

## 2019-02-15 MED ORDER — MORPHINE 100MG IN NS 100ML (1MG/ML) PREMIX INFUSION
1.0000 mg/h | INTRAVENOUS | Status: DC
  Administered 2019-02-15: 2 mg/h via INTRAVENOUS
  Filled 2019-02-15: qty 100

## 2019-02-15 MED ORDER — HEPARIN SODIUM (PORCINE) 1000 UNIT/ML DIALYSIS
1000.0000 [IU] | INTRAMUSCULAR | Status: DC | PRN
  Administered 2019-02-15: 10:00:00 2400 [IU] via INTRAVENOUS_CENTRAL
  Filled 2019-02-15 (×2): qty 6

## 2019-02-15 MED ORDER — SODIUM CHLORIDE 0.9 % FOR CRRT
INTRAVENOUS_CENTRAL | Status: DC | PRN
  Filled 2019-02-15: qty 1000

## 2019-02-16 LAB — GLUCOSE, CAPILLARY: Glucose-Capillary: 73 mg/dL (ref 70–99)

## 2019-02-16 LAB — COMPLEMENT, TOTAL: Compl, Total (CH50): 60 U/mL (ref >41)

## 2019-02-16 LAB — HEPARIN INDUCED PLATELET AB (HIT ANTIBODY): Heparin Induced Plt Ab: 0.086 OD (ref 0.000–0.400)

## 2019-02-19 ENCOUNTER — Other Ambulatory Visit: Payer: Self-pay | Admitting: Oncology

## 2019-02-28 NOTE — Progress Notes (Signed)
Updated Husband who lives in [place] on Pt condition and prognosis. Husband aware that Pt could pass anytime and that a morphine gtt has been started, Husband Asked for Rn to call when patient passed

## 2019-02-28 NOTE — Progress Notes (Addendum)
Pts daughter and son Cristie Hem and Will) at [facility]. MD Updated on Pt condition and prognosis. They declined to stay stating that they said their good by and said everything they need to say to the patient. Asked for Rn to call when patient passed

## 2019-02-28 NOTE — Progress Notes (Signed)
HEMATOLOGY-ONCOLOGY PROGRESS NOTE  SUBJECTIVE: Events overnight and this morning noted. The patient has been started on a Morphine drip. Son and daughter have been in earlier today to say their goodbyes. They have opted not to stay with the patient. Husband is in [facility] and notified of her status. Appears comfortable.   REVIEW OF SYSTEMS:   ROS could not be obtained due to patient condition.   I have reviewed the past medical history, past surgical history, social history and family history with the patient and they are unchanged from previous note.   PHYSICAL EXAMINATION:  Vitals:   2019/02/18 1200 02/18/19 1230  BP: 106/60 102/79  Pulse:    Resp: 17 18  Temp:    SpO2:     Filed Weights   02/13/19 0500 02/14/19 0325 02/18/2019 0442  Weight: 227 lb 4.7 oz (103.1 kg) 224 lb 10.4 oz (101.9 kg) 215 lb 6.2 oz (97.7 kg)    Intake/Output from previous day: 06/17 0701 - 06/18 0700 In: 3313.8 [I.V.:1763.8; NG/GT:1350; IV Piggyback:200] Out: 4959 [Stool:150]  GENERAL: Appears comfortable.  Eyes: Sclerae icteric ENT: ET tube in place Skin: Severe jaundice.  Neuro: Does not respond to pain or voice. Does not follow commands.  LABORATORY DATA:  I have reviewed the data as listed CMP Latest Ref Rng & Units 02/18/19 02/14/2019 02/14/2019  Glucose 70 - 99 mg/dL 75 - 75  BUN 6 - 20 mg/dL 14 - 16(X)  Creatinine 0.44 - 1.00 mg/dL 0.96 - 0.45  Sodium 409 - 145 mmol/L 134(L) - 136  Potassium 3.5 - 5.1 mmol/L 5.9(H) - 4.9  Chloride 98 - 111 mmol/L 101 - 100  CO2 22 - 32 mmol/L 11(L) - 18(L)  Calcium 8.9 - 10.3 mg/dL 8.1(L) - 8.5(L)  Total Protein 6.5 - 8.1 g/dL 5.9(L) - -  Total Bilirubin 0.3 - 1.2 mg/dL 35.3(HH) 35.6(HH) -  Alkaline Phos 38 - 126 U/L 694(H) - -  AST 15 - 41 U/L 1,189(H) - -  ALT 0 - 44 U/L 247(H) - -    Lab Results  Component Value Date   WBC 24.0 (H) 18-Feb-2019   HGB 9.3 (L) 02-18-2019   HCT 32.0 (L) 02/18/19   MCV 113.5 (H) Feb 18, 2019   PLT 30 (L) 02-18-19    NEUTROABS 17.0 (H) 02/18/19    Ct Head Wo Contrast  Result Date: 02/10/2019 CLINICAL DATA:  Follow-up examination for subdural hemorrhage. EXAM: CT HEAD WITHOUT CONTRAST TECHNIQUE: Contiguous axial images were obtained from the base of the skull through the vertex without intravenous contrast. COMPARISON:  Prior CT from 02/04/2019. FINDINGS: Brain: Postoperative changes from left-sided craniotomy for subdural evacuation again seen. Residual left extra-axial hematoma overlying the subjacent left cerebral convexity measures up to 8 mm in maximal thickness, improved from previous. Small amount of subdural blood seen along the posterior falx. Small amount of residual extra-axial pneumocephalus, markedly decreased from prior. Underlying intraparenchymal hematoma centered at the left temporal lobe has mildly dispersed as compared to previous exam, now measuring slightly larger in size at 3.5 x 4.2 x 2.1 cm (previously 3.1 x 4.2 x 2.2 cm when measured at similar dimensions. Surrounding low-density vasogenic edema has slightly increased. Adjacent small volume subarachnoid hemorrhage, similar. Persistent 4 mm left-to-right shift, unchanged. Overall ventricular size and morphology relatively unchanged without hydrocephalus or progressive ventricular trapping. Basilar cisterns remain patent. Evolving cytotoxic edema seen involving the parasagittal left occipital lobe (series 2, image 13), compatible with evolving left PCA territory infarct, more prominent as compared to  previous exam. No associated hemorrhage or mass effect. No other new acute intracranial hemorrhage. No other acute large vessel territory infarct. No mass lesion. Vascular: No hyperdense vessel. Skull: Postoperative changes from prior left craniotomy. No adverse features. Sinuses/Orbits: Globes and orbital soft tissues demonstrate no acute finding. Paranasal sinuses are largely clear. Chronic left orbital floor fracture noted. Nasogastric tube in place.  Trace bilateral mastoid effusions. Other: None. IMPRESSION: 1. Slight interval dispersion of left temporal lobe intraparenchymal hematoma, now measuring slightly increased in size at 3.5 x 4.2 x 2.1 cm (previously 3.1 x 4.2 x 2.2 cm). Surrounding vasogenic edema has slightly increased. Associated 4 mm left-to-right midline shift unchanged. 2. Interval decrease in size of left extra-axial hemorrhage status post left craniotomy for subdural evacuation, now measuring up to 8 mm in maximal thickness. 3. Evolving acute left PCA territory infarct. No associated hemorrhage or mass effect. 4. No other new acute intracranial process. Electronically Signed   By: Jeannine Boga M.D.   On: 02/10/2019 04:24   Ct Head Wo Contrast  Result Date: 02/04/2019 CLINICAL DATA:  Intracranial hemorrhage follow up EXAM: CT HEAD WITHOUT CONTRAST TECHNIQUE: Contiguous axial images were obtained from the base of the skull through the vertex without intravenous contrast. COMPARISON:  2019-02-28 FINDINGS: Brain: There is new, acute intraparenchymal hemorrhage within the left temporal lobe, measuring 3.4 x 2.8 x 2.5 cm (volume = 12 cm^3). There is small amount of overlying subarachnoid blood. There is moderate edema within the left temporal lobe. Patient is status post evacuation of a left-sided subdural hematoma. There is a subdural drainage catheter along the left convexity. Midline shift has substantially resolved. No hydrocephalus. Vascular: No abnormal hyperdensity of the major intracranial arteries or dural venous sinuses. No intracranial atherosclerosis. Skull: Status post left parietal craniotomy. Sinuses/Orbits: No fluid levels or advanced mucosal thickening of the visualized paranasal sinuses. No mastoid or middle ear effusion. The orbits are normal. IMPRESSION: 1. Acute intraparenchymal hematoma within the left temporal lobe with volume of 12 mL and moderate surrounding edema. Small amount of overlying subarachnoid blood. 2.  Status post evacuation of left hemispheric subdural hematoma with resolution of midline shift. Critical Value/emergent results were called by telephone at the time of interpretation on 02/04/2019 at 5:52 am to Ezra Sites, NP , who verbally acknowledged these results. Electronically Signed   By: Ulyses Jarred M.D.   On: 02/04/2019 05:52   Ct Head Wo Contrast  Result Date: 02/23/2019 CLINICAL DATA:  Posturing. Subdural hematoma. Liver failure, likely coagulopathy. EXAM: CT HEAD WITHOUT CONTRAST TECHNIQUE: Contiguous axial images were obtained from the base of the skull through the vertex without intravenous contrast. COMPARISON:  CT head 2019-02-28. FINDINGS: Brain: Acute LEFT subdural hematoma epicenter posterior frontotemporal convexity, is slightly larger compared with yesterday's scan, 28 mm. There is more central hyperattenuation suggesting interval acute bleeding. There is significant LEFT-to-RIGHT shift, 10-11 mm, now with early obstructive hydrocephalus of the RIGHT lateral ventricle. LEFT temporal horn is also more prominent, and partially trapped. Vascular: No hyperdense vessel or unexpected calcification. Skull: Normal. Negative for fracture or focal lesion. Sinuses/Orbits: No acute finding. Other: None. IMPRESSION: Worsening LEFT subdural hematoma, a few mm increased thickness compared to yesterday. Increasing central hyperattenuation suggesting further acute bleeding. LEFT-to-RIGHT shift of 10-11 mm, now with early obstructive hydrocephalus on the RIGHT and enlarging LEFT temporal horn. These results were communicated to Dr. Ronnald Ramp at 9:36 amon 06/27/2020by text page. Electronically Signed   By: Staci Righter M.D.   On: 02/05/2019 09:37  Ct Head Wo Contrast  Result Date: 02/05/2019 CLINICAL DATA:  Altered level of consciousness. EXAM: CT HEAD WITHOUT CONTRAST TECHNIQUE: Contiguous axial images were obtained from the base of the skull through the vertex without intravenous contrast. COMPARISON:   None. FINDINGS: Brain: There is a large acute left-sided subdural hematoma along the left frontoparietal convexity. It measures approximately 2.6 cm in its greatest dimension on the coronal view. There is effacement of the left lateral ventricle with a 6 mm rightward midline shift. Vascular: No hyperdense vessel or unexpected calcification. Skull: Normal. Negative for fracture or focal lesion. Sinuses/Orbits: No acute finding. The patient is status post bilateral cataract surgery. Other: None. IMPRESSION: Large subdural hematoma along the left frontoparietal convexity measuring 2.6 cm in width and causing a 6 mm rightward midline shift. These results were called by telephone at the time of interpretation on 02/06/2019 at 6:53 pm to Dr. Frederick PeersACHEL LITTLE , who verbally acknowledged these results. Electronically Signed   By: Katherine Mantlehristopher  Green M.D.   On: 02/07/2019 18:58   Ct Chest W Contrast  Result Date: 02/18/2019 CLINICAL DATA:  58 year old female with fall and abdominal pain. History of liver failure. EXAM: CT CHEST, ABDOMEN, AND PELVIS WITH CONTRAST TECHNIQUE: Multidetector CT imaging of the chest, abdomen and pelvis was performed following the standard protocol during bolus administration of intravenous contrast. CONTRAST:  100mL OMNIPAQUE IOHEXOL 300 MG/ML  SOLN COMPARISON:  None. FINDINGS: CT CHEST FINDINGS Cardiovascular: There is no cardiomegaly or pericardial effusion. The thoracic aorta is unremarkable. The origins of the great vessels of the arch appear patent as visualized. The central pulmonary arteries are unremarkable. Mediastinum/Nodes: There is no hilar or mediastinal adenopathy. Esophagus and thyroid gland are grossly unremarkable. No mediastinal fluid collection. Lungs/Pleura: The lungs are clear. There is no pleural effusion or pneumothorax. The central airways are patent. Musculoskeletal: Mild age indeterminate, likely chronic compression deformity of the superior endplates of T2 and T3. No  definite acute fracture. CT ABDOMEN PELVIS FINDINGS No intra-abdominal free air or free fluid. Hepatobiliary: Severe fatty infiltration of the liver. No intrahepatic biliary ductal dilatation. Probable sludge within the gallbladder. No calcified stone. No pericholecystic fluid. Pancreas: Unremarkable. No pancreatic ductal dilatation or surrounding inflammatory changes. Spleen: Normal in size without focal abnormality. Adrenals/Urinary Tract: The adrenal glands, kidneys, visualized ureters, and urinary bladder appear unremarkable. Stomach/Bowel: There is mild thickened appearance of the ascending colon and hepatic flexure, likely artifactual. Mild colitis is not excluded. Clinical correlation is recommended. There is no bowel obstruction. The appendix is normal. Vascular/Lymphatic: The abdominal aorta and IVC appear unremarkable. No portal venous gas. There is no adenopathy. Reproductive: Hysterectomy. No pelvic mass. Other: Diffuse subcutaneous edema. No fluid collection. Musculoskeletal: No acute or significant osseous findings. IMPRESSION: 1. No acute intrathoracic pathology. 2. Underdistention of the ascending colon versus less likely mild colitis. Clinical correlation is recommended. No bowel obstruction. Normal appendix. 3. Severe fatty infiltration of the liver. Electronically Signed   By: Elgie CollardArash  Radparvar M.D.   On: 02/16/2019 22:20   Ct Abdomen Pelvis W Contrast  Result Date: 02/21/2019 CLINICAL DATA:  58 year old female with fall and abdominal pain. History of liver failure. EXAM: CT CHEST, ABDOMEN, AND PELVIS WITH CONTRAST TECHNIQUE: Multidetector CT imaging of the chest, abdomen and pelvis was performed following the standard protocol during bolus administration of intravenous contrast. CONTRAST:  100mL OMNIPAQUE IOHEXOL 300 MG/ML  SOLN COMPARISON:  None. FINDINGS: CT CHEST FINDINGS Cardiovascular: There is no cardiomegaly or pericardial effusion. The thoracic aorta is unremarkable. The origins  of the  great vessels of the arch appear patent as visualized. The central pulmonary arteries are unremarkable. Mediastinum/Nodes: There is no hilar or mediastinal adenopathy. Esophagus and thyroid gland are grossly unremarkable. No mediastinal fluid collection. Lungs/Pleura: The lungs are clear. There is no pleural effusion or pneumothorax. The central airways are patent. Musculoskeletal: Mild age indeterminate, likely chronic compression deformity of the superior endplates of T2 and T3. No definite acute fracture. CT ABDOMEN PELVIS FINDINGS No intra-abdominal free air or free fluid. Hepatobiliary: Severe fatty infiltration of the liver. No intrahepatic biliary ductal dilatation. Probable sludge within the gallbladder. No calcified stone. No pericholecystic fluid. Pancreas: Unremarkable. No pancreatic ductal dilatation or surrounding inflammatory changes. Spleen: Normal in size without focal abnormality. Adrenals/Urinary Tract: The adrenal glands, kidneys, visualized ureters, and urinary bladder appear unremarkable. Stomach/Bowel: There is mild thickened appearance of the ascending colon and hepatic flexure, likely artifactual. Mild colitis is not excluded. Clinical correlation is recommended. There is no bowel obstruction. The appendix is normal. Vascular/Lymphatic: The abdominal aorta and IVC appear unremarkable. No portal venous gas. There is no adenopathy. Reproductive: Hysterectomy. No pelvic mass. Other: Diffuse subcutaneous edema. No fluid collection. Musculoskeletal: No acute or significant osseous findings. IMPRESSION: 1. No acute intrathoracic pathology. 2. Underdistention of the ascending colon versus less likely mild colitis. Clinical correlation is recommended. No bowel obstruction. Normal appendix. 3. Severe fatty infiltration of the liver. Electronically Signed   By: Elgie Collard M.D.   On: 02/03/2019 22:20   Dg Chest Port 1 View  Result Date: 02/14/2019 CLINICAL DATA:  Acute respiratory failure.  EXAM: PORTABLE CHEST 1 VIEW COMPARISON:  One-view chest x-ray 02/13/2019 FINDINGS: Endotracheal tube and bilateral IJ lines are stable. Feeding tube courses off the inferior border the film. Heart size is exaggerated by low lung volumes. There is no significant change in bilateral infrahilar airspace opacities, right greater than left. IMPRESSION: 1. Stable infrahilar airspace opacities bilaterally, likely reflecting atelectasis. Infection is not excluded. 2. Support apparatus is stable. Electronically Signed   By: Marin Roberts M.D.   On: 02/14/2019 07:32   Dg Chest Port 1 View  Result Date: 02/13/2019 CLINICAL DATA:  Order for acute respiratory failure EXAM: PORTABLE CHEST 1 VIEW COMPARISON:  Chest x-rays dated 02/12/2019 and 02/11/2019. FINDINGS: Tubes and lines are stable in position. Stable cardiomegaly. Bibasilar opacities are unchanged. No pleural effusion or pneumothorax seen. IMPRESSION: Stable chest x-ray. Support apparatus is stable. Bibasilar opacities are unchanged, atelectasis versus pneumonia. Stable cardiomegaly. Electronically Signed   By: Bary Richard M.D.   On: 02/13/2019 08:26   Dg Chest Port 1 View  Result Date: 02/12/2019 CLINICAL DATA:  Intubation. EXAM: PORTABLE CHEST 1 VIEW COMPARISON:  02/11/2019.  02/04/2019. FINDINGS: Interim extubation. Feeding tube and bilateral IJ lines in stable position. Stable cardiomegaly. Bilateral pulmonary interstitial prominence, progressed from prior exam. A component of CHF could present this fashion. Bibasilar atelectasis/infiltrates again noted. No pneumothorax. IMPRESSION: 1. Interim extubation. Feeding tube and bilateral IJ lines in stable position. 2. Stable cardiomegaly. Bilateral from interstitial prominence, progressed from prior exam. A component of CHF could present this fashion. 3.  Bibasilar atelectasis/infiltrates again Electronically Signed   By: Maisie Fus  Register   On: 02/12/2019 08:04   Dg Chest Port 1 View  Result Date:  02/11/2019 CLINICAL DATA:  Central line clotted EXAM: PORTABLE CHEST 1 VIEW COMPARISON:  Portable exam 1556 hours compared to 02/10/2019 FINDINGS: Tip of endotracheal tube projects 6.8 cm above carina. Feeding tube extends into stomach. New RIGHT jugular central  venous catheter with tip projecting over SVC. LEFT jugular line unchanged. Enlargement of cardiac silhouette. Mediastinal contours normal. Low lung volumes with bibasilar atelectasis. No acute infiltrate, pleural effusion or pneumothorax. IMPRESSION: No pneumothorax following RIGHT jugular line placement. Enlargement of cardiac silhouette with bibasilar atelectasis. Electronically Signed   By: Ulyses SouthwardMark  Boles M.D.   On: 02/11/2019 16:35   Dg Chest Port 1 View  Result Date: 02/10/2019 CLINICAL DATA:  58 year old female EXAM: PORTABLE CHEST 1 VIEW COMPARISON:  02/06/2019 FINDINGS: Left rotation somewhat limits evaluation. Low lung volumes with patchy opacities at the bilateral lung bases. Blunting of the left costophrenic angle. No pneumothorax.  Mild interlobular septal thickening. Endotracheal tube terminates at the carina, near the right mainstem bronchus. Withdrawal of 4-5 cm may position this better at the clavicular heads. Interval placement of left IJ central venous catheter. The tip projects over the aortic arch on this plain film, though there is left rotation of the patient. No pneumothorax. Feeding tube projects over the mediastinum and terminates in the abdomen out of the field of view. IMPRESSION: Endotracheal tube terminates at the carina directed towards the right mainstem. Withdrawal of 4-5 cm may position this more optimally. Left IJ central venous catheter has been placed with no pneumothorax. The tip cannot be located on this plain film. Enteric tube has been placed projecting over the mediastinum and terminating out of the field of view in the abdomen. These above results were discussed by telephone at the time of interpretation on  02/10/2019 at 2:12 pm with the nurse caring for the patient Ms Tammy. Low lung volumes with possible combination of atelectasis/consolidation, edema, and possible left pleural effusion. Electronically Signed   By: Gilmer MorJaime  Wagner D.O.   On: 02/10/2019 14:13   Dg Chest Port 1 View  Result Date: 02/06/2019 CLINICAL DATA:  Check endotracheal tube placement EXAM: PORTABLE CHEST 1 VIEW COMPARISON:  02/04/2019 FINDINGS: Cardiac shadow is stable. Endotracheal tube is noted approximately 1 cm above the carina. Gastric catheter is noted within the stomach. The lungs are clear with the exception of some platelike atelectasis in the bases bilaterally. IMPRESSION: Stable changes when compared with the prior exam. Electronically Signed   By: Alcide CleverMark  Lukens M.D.   On: 02/06/2019 08:09   Dg Chest Port 1 View  Result Date: 02/04/2019 CLINICAL DATA:  Mechanical ventilation. EXAM: PORTABLE CHEST 1 VIEW COMPARISON:  02/22/2019 FINDINGS: Endotracheal tube is roughly 1.6 cm above the carina. Nasogastric tube extends into the abdomen but the tip is beyond the image. Again noted are a few densities at the lung bases and most compatible with atelectasis. Upper lungs are clear. Negative for a pneumothorax. Heart size is upper limits of normal and stable. IMPRESSION: 1. Basilar atelectasis with minimal change. 2. Support apparatuses as described. Electronically Signed   By: Richarda OverlieAdam  Henn M.D.   On: 02/04/2019 09:38   Dg Chest Portable 1 View  Result Date: 02/26/2019 CLINICAL DATA:  Hypoxia EXAM: PORTABLE CHEST 1 VIEW COMPARISON:  Chest radiograph and chest CT February 02, 2019 FINDINGS: Endotracheal tube tip is 1.8 cm above the carina. Nasogastric tube tip and side port are below the diaphragm. No pneumothorax. There is atelectasis in the left base. The lungs elsewhere are clear. Heart is upper normal in size with pulmonary vascularity normal. No adenopathy. No bone lesions. IMPRESSION: Tube positions as described without pneumothorax. Slight  left base atelectasis. No edema or consolidation. Stable cardiac silhouette. Electronically Signed   By: Bretta BangWilliam  Woodruff III M.D.  On: 02/09/2019 08:15   Dg Chest Port 1 View  Result Date: 02/18/2019 CLINICAL DATA:  Shortness of breath EXAM: PORTABLE CHEST 1 VIEW COMPARISON:  None. FINDINGS: There is focal airspace opacity in the medial right base. There is mild atelectatic change in the left base. Lungs elsewhere are clear. Heart is upper normal in size with pulmonary vascularity normal. No adenopathy. No bone lesions. IMPRESSION: Focal consolidation medial right base concerning for pneumonia. Left base atelectasis. Lungs elsewhere clear. Heart upper normal in size. Followup PA and lateral chest radiographs recommended in 3-4 weeks following trial of antibiotic therapy to ensure resolution and exclude underlying malignancy. Electronically Signed   By: Bretta Bang III M.D.   On: 02/19/2019 18:44   US Abdomen Limited Ruq  Result Date: 02/09/2019 CLINICAL DATA:  Abnormal transaminase level. EXAM: ULTRASOUND ABDOMEN LIMITED RIGHT UPPER QUADRANT COMPARISON:  Ultrasound of February 03, 2019. FINDINGS: Gallbladder: No gallstones or wall thickening visualized. No sonographic Murphy sign noted by sonographer. Sludge is noted within gallbladder lumen. Common bile duct: Diameter: 3 mm which is within normal limits. Liver: No focal lesion identified. Increased echogenicity of hepatic parenchyma is noted. Portal vein is patent on color Doppler imaging with normal direction of blood flow towards the liver. IMPRESSION: Sludge noted within gallbladder lumen.  Hepatic steatosis. Electronically Signed   By: Lupita Raider M.D.   On: 02/09/2019 09:49   US Abdomen Limited Ruq  Result Date: 02/01/2019 CLINICAL DATA:  58 year old with acute liver failure.  Jaundice. EXAM: ULTRASOUND ABDOMEN LIMITED RIGHT UPPER QUADRANT COMPARISON:  CT 02/02/2019 FINDINGS: Gallbladder: Echogenic sludge in the gallbladder. Concern for  asymmetric gallbladder wall thickening but this apparent wall thickening could also be related to focal fat sparing in the adjacent liver. Reportedly, sonographic Murphy sign cannot be evaluated due to patient's mental status. Common bile duct: Diameter: 0.3 cm Liver: No focal lesion. Diffusely increased echogenicity is suggestive for hepatic steatosis. Limited evaluation of the internal architecture due to the steatosis. No intrahepatic biliary dilatation. Portal vein is patent on color Doppler imaging with normal direction of blood flow towards the liver. IMPRESSION: 1. Hepatic steatosis.  No focal liver lesion. 2. Gallbladder sludge and questionable asymmetric gallbladder wall thickening. No biliary dilatation. Electronically Signed   By: Richarda Overlie M.D.   On: 01/30/2019 18:45    ASSESSMENT: 58 y.o. [place], [place] female with             (1) anemia                          (2) thrombocytopenia                          (3) acute liver failure              (4) acute kidney injury  PLAN: Lourene has anemia and thrombocytopenia in the setting of acute liver failure and acute kidney injury.  Her anemia and thrombocytopenia are likely multifactorial. Review of blood film show occ targets, stomatocytes and spherocytes but no schistocytes; there are NRBCs but I do not see teardops; the WBC show left shift to the myelocyte level, no blasts; there are no platelet clumps.  Her reticulocyte count is elevated as is her LDH.  Haptoglobin is normal and Coombs test is negative.  HIT panel is pending.  The reticulocyte, LDH, and total bilirubin are all elevated consistent with hemolysis.  We are dealing  with liver failure complicated by extravascular hemolysis, likely the liver.  Unfortunately, there are no therapeutic options beyond the care already being provided.  Unfortunately, the patient is rapidly declining and hospital death is imminent.  The patient is currently on a morphine drip and comfortable.   Her family has opted not to stay with her but has been by to say their goodbyes.  Recommend autopsy to determine the cause of her liver failure at the family is agreeable.    LOS: 12 days   Clenton PareKristin Curcio, DNP, AGPCNP-BC, AOCNP 2018-11-23

## 2019-02-28 NOTE — Progress Notes (Addendum)
eLink Physician-Brief Progress Note Patient Name: Taylor Robinson DOB: 1961/06/25 MRN: [id number]   Date of Service  2019-03-08  HPI/Events of Note  Hypoglycemia  eICU Interventions  Increase d10 to 125 cc/hour Also notified that patient is now max'd out on 3 pressors DNR status in place, she is gravely ill  If she survives then would strongly consider comfort measures only      Intervention Category Minor Interventions: Other:  Margaretmary Lombard 03-08-19, 4:53 AM

## 2019-02-28 NOTE — Progress Notes (Signed)
Heide Guile RN and this RN wasted 80 ml of IV morphine in stericycle

## 2019-02-28 NOTE — Progress Notes (Signed)
Subjective: Interval History: deterioration over night, high dose prssors, low bps, anticipate poss comfort measures  Objective: Vital signs in last 24 hours: Temp:  [97 F (36.1 C)-97.7 F (36.5 C)] 97 F (36.1 C) (06/18 0400) Pulse Rate:  [57-91] 57 (06/18 0820) Resp:  [18-38] 22 (06/18 0820) BP: (50-141)/(10-85) 99/57 (06/18 0820) SpO2:  [89 %-100 %] 98 % (06/18 0820) FiO2 (%):  [30 %-60 %] 60 % (06/18 0820) Weight:  [97.7 kg] 97.7 kg (06/18 0442) Weight change: -4.2 kg  Intake/Output from previous day: 06/17 0701 - 06/18 0700 In: 3313.8 [I.V.:1763.8; NG/GT:1350; IV Piggyback:200] Out: 4959 [Stool:150] Intake/Output this shift: Total I/O In: 303.5 [I.V.:253.5; NG/GT:50] Out: 295 [Other:295]  General appearance: icteric and nonresponsive Neck: IJ cath Resp: rhonchi bilaterally Cardio: regular rate and rhythm GI: obese,no bs,mod distension Extremities: edema 3+  Lab Results: Recent Labs    02/14/19 0334 02/14/19 1515 02/25/2019 0411  WBC 19.3*  --  24.0*  HGB 8.8*  --  9.3*  HCT 27.1*  --  32.0*  PLT 58* 38* 30*   BMET:  Recent Labs    02/14/19 1756 02/21/2019 0411  NA 136 134*  K 4.9 5.9*  CL 100 101  CO2 18* 11*  GLUCOSE 75 75  BUN 24* 14  CREATININE 0.87 0.74  CALCIUM 8.5* 8.1*   No results for input(s): PTH in the last 72 hours. Iron Studies: No results for input(s): IRON, TIBC, TRANSFERRIN, FERRITIN in the last 72 hours.  Studies/Results: Dg Chest Port 1 View  Result Date: 02/14/2019 CLINICAL DATA:  Acute respiratory failure. EXAM: PORTABLE CHEST 1 VIEW COMPARISON:  One-view chest x-ray 02/13/2019 FINDINGS: Endotracheal tube and bilateral IJ lines are stable. Feeding tube courses off the inferior border the film. Heart size is exaggerated by low lung volumes. There is no significant change in bilateral infrahilar airspace opacities, right greater than left. IMPRESSION: 1. Stable infrahilar airspace opacities bilaterally, likely reflecting atelectasis.  Infection is not excluded. 2. Support apparatus is stable. Electronically Signed   By: San Morelle M.D.   On: 02/14/2019 07:32    I have reviewed the patient's current medications.  Assessment/Plan: 1 AKI ATN  Low bicarb, ^ K , change CRRT to facilitate 2 Anemia stable 3 shock  On Pressors 4 DM 5 Liver failure 6 VDRF per CCM 7 Low ptlt 8SDH P change CRRT, give bicarb, ^ rates, counsel family    LOS: 12 days   Taylor Robinson 02/09/2019,8:45 AM

## 2019-02-28 NOTE — Progress Notes (Signed)
NAME:  Taylor Robinson, MRN:  [id number], DOB:  09/09/1960, LOS: 12 ADMISSION DATE:  02/20/2019, CONSULTATION DATE:  02/19/2019 REFERRING MD:  Clarene DukeLittle, CHIEF COMPLAINT:  Confusion   Brief History   58 y/o female admitted form home with jaundice, nausea and vomiting which developed over weeks, eventually developed confusion and found to have a subdural hemorrhage.  Required hematoma evacuation by neurosurgery on 6/6.    Past Medical History  Hypothyroidism, DM2, GERD, obestiy  Significant Hospital Events   6/5 - smear review - normocytic anemia 6/6 craniotomy - [facility] - decision to give vit k and [facility]  6/7  GI consult - Etiology of hyperbilirubinemia is unclear; multiple serologies are pending - "  Profound hyperbilirubinemia with much less elevated other liver function tests.  I am not sure this is liver failure, but assessment under current patient condition (intubation, minimally arousable) is difficult.  Relative normal INR argues at least against progressive liver failure.  No evidence of biliary obstruction".   6/10 - GI consult - supportive care. No role for steroids, Serologies negative. Liver bx risk felt to outweigh benefits - "It is felt this is most likely a drug reaction, superimposed likely on chronic hepatic steatosis, but certainly hemolysis from intracranial bleeding could lead to an enhanced degree of hyperbilirubinemia.". GI d/w [facility] Dr Sharlene MottsBerg - not felt to be a transplant candidate because INR normal and not felt to be liver failure + recent craniotomy  6/11 extubated  6/13 - reintubated and central line - WBC elevated, INR OK, Bili higher today, Mental status worse. Ammonia level 107   6/14 - remain intubated on vent. On levophed. CVP 18. Creat up at 4.25 (admit 1.26). K 5.1, bic 17. Plat dropped to 82. INR 1.2 bili 34 and worse (per RN - bed bound for few months prior to admission - per daughter related to concussion). On daily albumin. Goal BP - MAP >  80 . Makes 15-20cc/h urine   Consults:  NSGY GI Renal Hematology  Procedures:  6/6 ETT > 6/11, 6/13 >> 6/6 L Craniotomy for subdural hematoma CVC 6/13 >> HD cath 6/14 >>   Significant Diagnostic Tests:  6/5 CTH >>Large subdural hematoma along the left frontoparietal convexity measuring 2.6 cm in width and causing a 6 mm rightward midline shift. 6/5 CT abd/pelvis + 6/5  CT chest: No acute intrathoracic pathology. Underdistention of the ascending colon versus less likely mild Colitis. Severe fatty infiltration of the liver. 6/6 RUQ ultrasound > steatosis, gludge CT head 6/7  reveals improvement in shift after evacuation, new focus of bleeding posterior temporal.  6/12 RUQ ultrasound > sludge in gallbladder lumen, hepatic steatosis  Micro Data:  6/5 SARS coronavirus 2 >> negative 6/5 BCx 2 >>  negative 6/6 BCx >> negative 6/5 UC >> insignificant growth 6/15 UC >> 100 K cfu  Pseudomonas aeruginosa >> pansensitive  Antimicrobials:  Cefazolin 6/6 x1 Cefepime 6/16 >>   Interim history/subjective:  Significant change overnight, increased pressor needs, clearly less stable Profound increase in her AST noted, worsening acidosis Appreciate hematology consult  Objective   Blood pressure 103/75, pulse (!) 59, temperature (!) 97 F (36.1 C), temperature source Axillary, resp. rate (!) 23, height 5\' 6"  (1.676 m), weight 97.7 kg, SpO2 95 %.    Vent Mode: PRVC FiO2 (%):  [30 %-60 %] 60 % Set Rate:  [18 bmp] 18 bmp Vt Set:  [470 mL] 470 mL PEEP:  [5 cmH20] 5 cmH20  Plateau Pressure:  [10 cmH20] 10 cmH20   Intake/Output Summary (Last 24 hours) at 02/23/2019 0755 Last data filed at 02/26/2019 0700 Gross per 24 hour  Intake 3313.75 ml  Output 4959 ml  Net -1645.25 ml   Filed Weights   02/13/19 0500 02/14/19 0325 02/22/2019 0442  Weight: 103.1 kg 101.9 kg 97.7 kg   General Appearance: Acute and chronically ill woman, ventilated Head: Left temple staples in place, clean and dry Eyes:  Severe scleral icterus, mild scleral edema Throat: ET tube, OG tube both in place, no secretions Neck: Left IJ CVC, right IJ HD catheter, no lymphadenopathy Lungs: Coarse bilateral breath sounds, few scattered inspiratory crackles Heart: Regular, no murmur, norepi 40, epi 20, vasopressin Abdomen: Soft, obese, nondistended with positive bowel sounds Extremities: Trace to 1+ pretibial edema Skin: Severely jaundiced, no rash Neurologic: Does not respond to pain, stimulus   LABS    PULMONARY Recent Labs  Lab 02/10/19 1236 02/11/19 1148 02/13/19 0420 02/14/19 0338  PHART 7.262* 7.418 7.518* 7.482*  PCO2ART 46.4 30.8* 30.8* 29.9*  PO2ART 110.0* 159.0* 79.0* 58.9*  HCO3 20.9 19.9* 25.1 22.1  TCO2 22 21* 26  --   O2SAT 97.0 99.0 97.0 90.1    CBC Recent Labs  Lab 02/13/19 0500 02/14/19 0334 02/14/19 1515 02/18/2019 0411  HGB 8.2* 8.8*  --  9.3*  HCT 24.8* 27.1*  --  32.0*  WBC 10.0 19.3*  --  24.0*  PLT 50* 58* 38* 30*    COAGULATION Recent Labs  Lab 02/10/19 0613 02/11/19 0445 02/12/19 0500 02/13/19 0500 02/14/19 1515  INR 1.3* 1.2 1.2 1.1 1.5*    CARDIAC  No results for input(s): TROPONINI in the last 168 hours. No results for input(s): PROBNP in the last 168 hours.   CHEMISTRY Recent Labs  Lab 02/11/19 0445  02/12/19 0500  02/13/19 0500 02/13/19 1520 02/14/19 0334 02/14/19 1756 02/07/2019 0411  NA 135   < > 137   < > 136 136 135 136 134*  K 5.1   < > 4.3   < > 4.1 4.3 4.7 4.9 5.9*  CL 104  --  103   < > 101 101 100 100 101  CO2 17*  --  21*   < > 23 24 21* 18* 11*  GLUCOSE 122*  --  136*   < > 131* 117* 126* 75 75  BUN 115*  --  80*   < > 38* 35* 27* 24* 14  CREATININE 4.25*  --  2.63*   < > 1.19* 1.20* 0.96 0.87 0.74  CALCIUM 8.3*  --  8.4*   < > 8.6* 8.7* 8.5* 8.5* 8.1*  MG 2.3  --  2.4  --  2.4  --  2.5*  --  2.9*  PHOS 4.2  --  4.4   < > 3.1 3.4 3.5 5.2* 7.0*   < > = values in this interval not displayed.   Estimated Creatinine Clearance: 92.6  mL/min (by C-G formula based on SCr of 0.74 mg/dL).   LIVER Recent Labs  Lab 02/10/19 0613 02/11/19 0445 02/12/19 0500  02/13/19 0500 02/13/19 1520 02/14/19 0334 02/14/19 1515 02/14/19 1756 02/14/19 1843 02/13/2019 0411  AST 279* 232* 246*  --  310*  --  382*  --   --   --  1,189*  ALT 69* 67* 74*  --  91*  --  117*  --   --   --  247*  ALKPHOS 399* 433* 402*  --  504*  --  620*  --   --   --  694*  BILITOT 33.8* 34.2* 34.4*  --  32.6*  --  35.8*  --   --  35.6* 35.3*  PROT 4.9* 5.2* 5.5*  --  5.4*  --  5.7*  --   --   --  5.9*  ALBUMIN 1.6* 1.7* 2.0*  2.0*   < > 1.8*  1.8* 1.6* 1.6*  --  1.5*  --  1.6*  1.6*  INR 1.3* 1.2 1.2  --  1.1  --   --  1.5*  --   --   --    < > = values in this interval not displayed.     INFECTIOUS Recent Labs  Lab 02/12/19 0010 02/12/19 0500 02/13/19 0500  LATICACIDVEN 1.6  --  2.3*  PROCALCITON  --  4.52  --      ENDOCRINE CBG (last 3)  Recent Labs    02/14/19 2333 03-17-19 0434 03-17-19 0547  GLUCAP 129* 69* 147*     Resolved Hospital Problem list     Assessment & Plan:  ASSESSMENT / PLAN:  PULMONARY A:  Acute resp failure at admit 02/01/2019 due to Subdural but reintubated 6/13 due to encephalopathy P:   Continue current ventilator strategy, 8 cc/kg.  She does not have the mental status reactivation.  She is progressively worse and do not believe she will progress to extubation.  Plan to discuss with family  NEUROLOGIC A:   Acute encepahlopathy -multifactorial encephalopathy, presumed meds and acute renal failure superimposed on hepatic encephalopathy, and SDH P:   Given her overall progression, worsening would seem reasonable to initiate some low-dose comfort based medication despite her encephalopathy Lactulose as ordered Appreciate neurosurgery assistance and management  VASCULAR A:    Progressive shock, related to sepsis, component of relative hypovolemia with volume removal with CVVH P:  Now on multiple  pressors with only marginal improvement, continued instability Continue antibiotics   INFECTIOUS A:   Pseudomonas urinary tract infection Hepatitis panel, HIV, CMV negative P:   Continue cefepime started 6/16  RENAL A:  Acute renal failure, unclear precipitating cause, presumed ATN.  Urine eosinophils negative P:  Continue CVVH as ordered for now.  Family understands that we are supporting fully in order to see if there is a reversible cause to her overall decompensation, respiratory failure, hepatic failure, renal failure.  He indicated that he does not believe the patient would want prolonged hemodialysis support.  GASTROINTESTINAL A:   Jaundice of unclear etiology.  Hepatic evaluation shows steatosis but overall does not support fulminant hepatic failure.  Note CT abdomen 6/5, right upper quadrant ultrasound was done on 6/6 and also 6/12.  Question hemolysis as cause, hepatic congestion? Transaminases rising as of 6/17, INR stable Severe hyperbilirubinemia, stable at 34 Ammonia peak 107 > 80 >  ADAMTS 13 activity > 15% (very low) P:   Continue lactulose, follow ammonia Appreciate gastroenterology assistance.  In absence of any evidence for fulminant failure they are following at a distance.  There is now some increase in her transaminases.  Question medication reaction?   Check portal venous doppler US to r/o   HEMATOLOGIC A:  Anemia, question hemolysis Thromboctyopenia, initial smear did not support TTP, but now question ADAMTS-13 activity as above P:  Appreciate hematology input.  They confirmed that there really is no clear evidence for active hemolysis or TTP.  No role for plasmapheresis  ENDOCRINE A:   At risk hyper/hypoglycemia  P:   SSI as ordered   MSK/DERM Groin and inframmamary - moisture related and painful STage 2 sacral decub v moisture related -   - both pre-admit P Dressing changes as per wound care     Best practice:  Diet: start tube feeding  after intubation Pain/Anxiety/Delirium protocol (if indicated): yes, prn fentanyl, versed, RASS target 0 to -1 VAP protocol (if indicated): yes DVT prophylaxis: SCD GI prophylaxis: PPI Glucose control: SSI Mobility: bed rest Code Status: full  Family Communication:   6/6 - PCCM - called and spoke with the patient's daughter Trinna Postlex, telephone #[phone]- [phone].  She also has a younger brother who is 56 years old.  They are currently making medical decisions for the patient.  Upon further discussion with her she does state that her mother is married.  They are separated but not legally divorced.  Her husband is at [employer] who [place].  And the daughter has attempted to reach out to him this morning but there has been no answer.  His telephone number is [phone].  I have attempted to call the number provided by his daughter to reach [employer] and there was no answer.  6/7 - husband updated  6/13  - updated her husband Harvie HeckRandy on 6/13, they are separated.  He really doesn't have much clue about her prognosis.  I let him know that I think it is terrible and she is not a liver transplant candidate.  He voiced understanding.  He says her adult children are not really in a place to make decisions about her condition.  He recommended we proceed with intubaiton and supportive care and he would start talking to her kids about goals of care.  6/14 -  PCCM MD called Daughter Docia Chucklex Carey [phone]) = aware patient back on vent. Reports she has a brother who is 2218 and apparently "knows everything". Daughter says she prefers first call is to the separated husband / step dad and he updates kids but ok to call her as well esp if we cannot get hold of him.  Updated - daughter is agreeable to DNR if arrest but still full medical care but wants to talk to step dad/separated husband. For now see outcome of TTP workup and renal consult . Later husband called and confirmed to RN NO CPR but full  medical care. Cal husband first  Updated Randy 6/15, 6/17. Attempted to cal 6/18 but unable to reach. Will try him again. Do not believe she can survive this, need to get family in to see her if possible.   Disposition: ICU  Independent CC time 33 minutes   Levy Pupaobert Jeric Slagel, MD, PhD 11-12-2018, 7:55 AM [facility] [phone] or if no answer [phone]

## 2019-02-28 NOTE — Significant Event (Signed)
PCCM Interval Note  I was able to reach [facility], patient's daughter. Relayed info regarding patient's changing status. Explained that unfortunately she is declining despite full support and will not survive.   We will facilitate visitation today asap. Suspect we will CVVHD around that time. Discussed this plan with Dr Deterding who concurs. I don't want to stop it immediately since it's presence may allow the family more time to arrive.   I will continue to try to reach out to [place], but in the meantime will work on getting local family in to see the patient.   Independent CC time 20 minutes  Baltazar Apo, MD, PhD 02/18/2019, 9:11 AM [facility] [phone] or if no answer [phone]

## 2019-02-28 NOTE — Progress Notes (Signed)
Hypoglycemic Event  CBG: 69  Treatment: D50 50 mL (25 gm)  Symptoms: None  Follow-up CBG: Time: 0488 CBG Result:149  Possible Reasons for Event: Unknown  Comments/MD notified:ELINK    Rulon Abide

## 2019-02-28 NOTE — Progress Notes (Signed)
Notified Pts Husband and Daughter of Patient time of Death 14:23  Ylianna, Almanzar Spouse   [place] Daughter   [phone]

## 2019-02-28 DEATH — deceased

## 2019-03-16 DIAGNOSIS — D696 Thrombocytopenia, unspecified: Secondary | ICD-10-CM | POA: Diagnosis not present

## 2019-03-16 DIAGNOSIS — G9340 Encephalopathy, unspecified: Secondary | ICD-10-CM | POA: Diagnosis present

## 2019-03-16 DIAGNOSIS — B965 Pseudomonas (aeruginosa) (mallei) (pseudomallei) as the cause of diseases classified elsewhere: Secondary | ICD-10-CM | POA: Diagnosis not present

## 2019-03-16 DIAGNOSIS — E119 Type 2 diabetes mellitus without complications: Secondary | ICD-10-CM

## 2019-03-16 DIAGNOSIS — A419 Sepsis, unspecified organism: Secondary | ICD-10-CM | POA: Diagnosis not present

## 2019-03-16 DIAGNOSIS — N390 Urinary tract infection, site not specified: Secondary | ICD-10-CM | POA: Diagnosis not present

## 2019-03-16 DIAGNOSIS — N179 Acute kidney failure, unspecified: Secondary | ICD-10-CM | POA: Diagnosis not present

## 2019-03-31 NOTE — Death Summary Note (Signed)
  DEATH SUMMARY   Patient Details  Name: Taylor Robinson MRN: [id number] DOB: 11-21-1960  Admission/Discharge Information   Admit Date:  2019/02/23  Date of Death: Date of Death: 03-08-2019  Time of Death: Time of Death: 1422/01/01  Length of Stay: 31-Dec-2022  Referring Physician: Hermine Messick, MD   Reason(s) for Hospitalization  Jaundice and acute encephalopathy  Diagnoses  Preliminary cause of death:   Acute liver failure  Secondary Diagnoses (including complications and co-morbidities):  Principal Problem:   Acute liver failure Active Problems:   SDH (subdural hematoma) (HCC)   S/P craniotomy   Altered mental status   Acute respiratory failure with hypoxemia ([place])   Diabetes mellitus without complication ([place])   Acute renal failure (ARF) (HCC)   Pseudomonas urinary tract infection   Septic shock (HCC)   Thrombocytopenia (HCC)   Encephalopathy acute   Brief Hospital Course (including significant findings, care, treatment, and services provided and events leading to death)  Taylor Robinson was a 58 y.o. year old female admitted from home 2023-02-23 with jaundice, nausea and vomiting which developed over weeks, eventually developed confusion. She had significant hyperbilirubinemia and mild transaminitis consistent with a hepatic injury or possible hemolysis. No evidence of hemolysis on smear or other workup. No biliary obstruction on imaging, just steatosis. CT head showed an acute on chronic SDH and she required craniotomy 6/6. She was extubated on 6/11, but showed an overall decline over the next several days with evolving shock, thrombocytopenia and progressive transaminitis and renal failure. She was reintubated 6/13 for encephalopathy, unresponsive to lactulose. CVVHD was initiated to determine whether MS would improve with renal support. Unfortunately this was not the case. She received hemodynamic support, empiric abx. Cx data revealed a pseudomonal UTI. Evaluation for TTP or HUS, hemolysis was reassuring.  Despite maximal support she developed progressive shock, transaminitis, on 03/08/23. Based on her overall decline and the likelihood that she would continue to progress, discussions were undertaken with family so that they could visit. She expired on March 08, 2019.    Procedures/Operations   Craniotomy 6/6.    Rose Fillers Merrick Maggio 03/16/2019, 4:33 PM

## 2019-10-28 IMAGING — CT CT HEAD WITHOUT CONTRAST
4 series · 15 of 47 positions shown, 17 images · non-contrast
Comparison: Prior CT from 02/04/2019.

CLINICAL DATA: Follow-up examination for subdural hemorrhage.

EXAM:
CT HEAD WITHOUT CONTRAST
TECHNIQUE: Contiguous axial images were obtained from the base of the skull
through the vertex without intravenous contrast.

[Series 2: head without · axial · non-contrast · 0.44mm/px · z∈[-161,-41]mm · 7 of 34 slices shown, 9 images]
[im 5/34  brain]
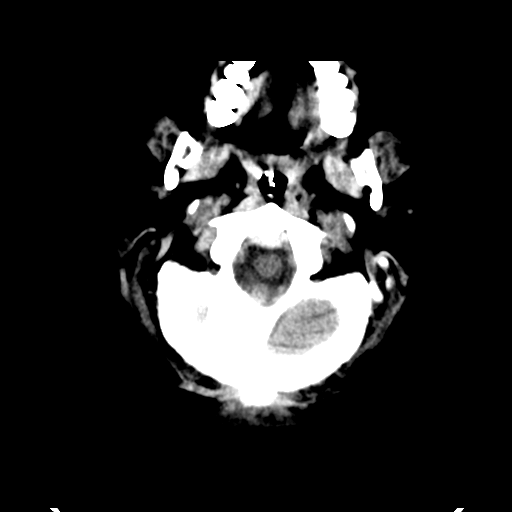
[im 5/34  bone]
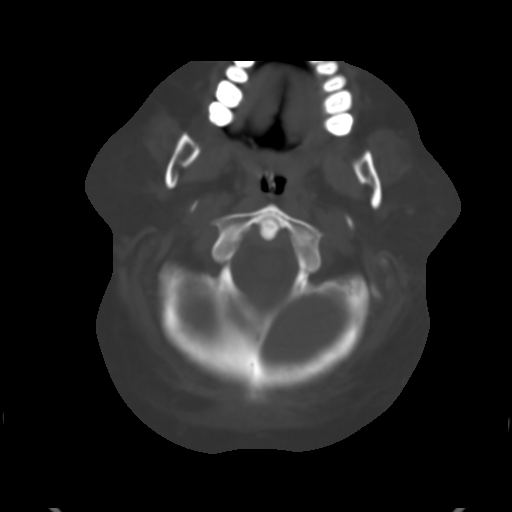
[im 9/34  brain]
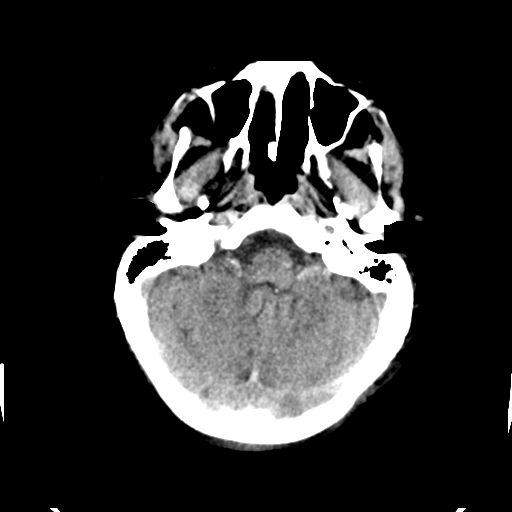
[im 13/34  brain]
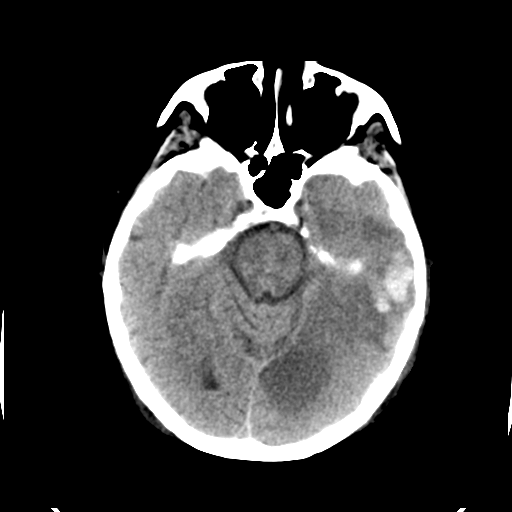
[im 17/34  brain]
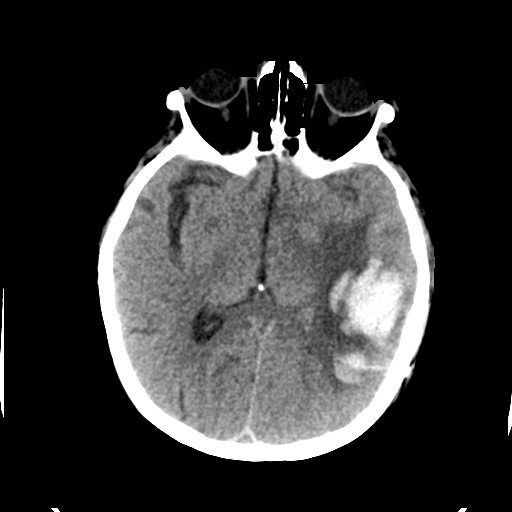
[im 21/34  brain]
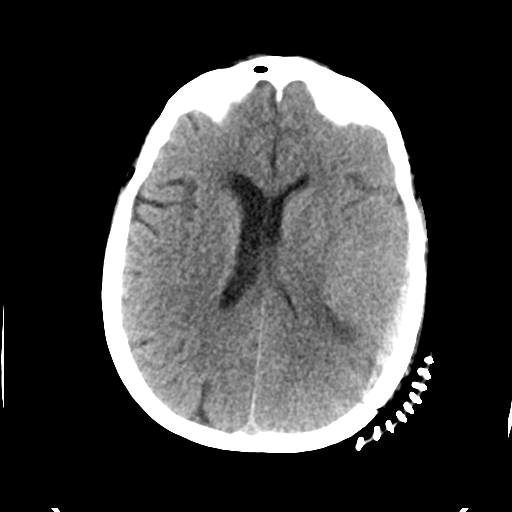
[im 21/34  bone]
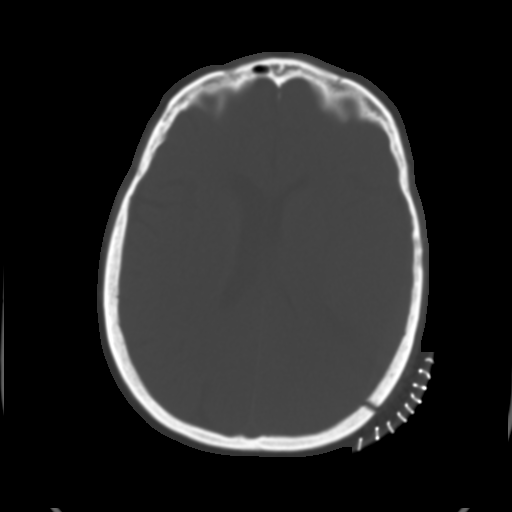
[im 25/34  brain]
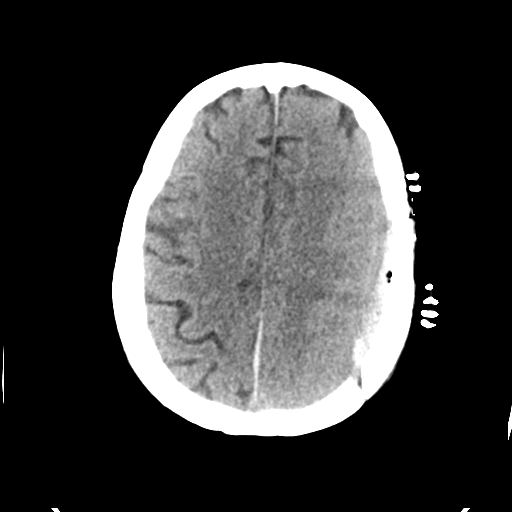
[im 29/34  brain]
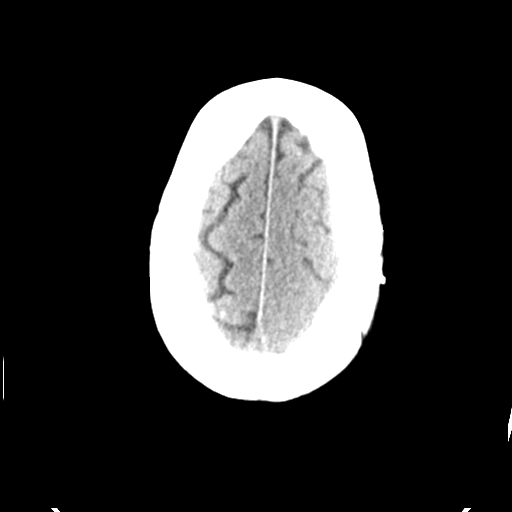

[Series 3: head bone · axial · 0.44mm/px · z∈[-165,-149]mm · 2 of 84 slices shown]
[im 9/84  bone]
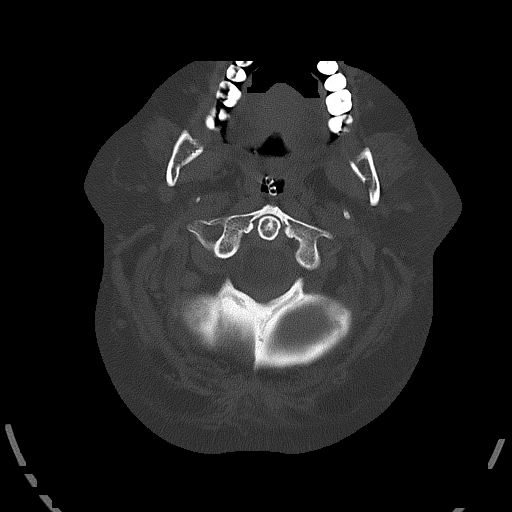
[im 17/84  bone]
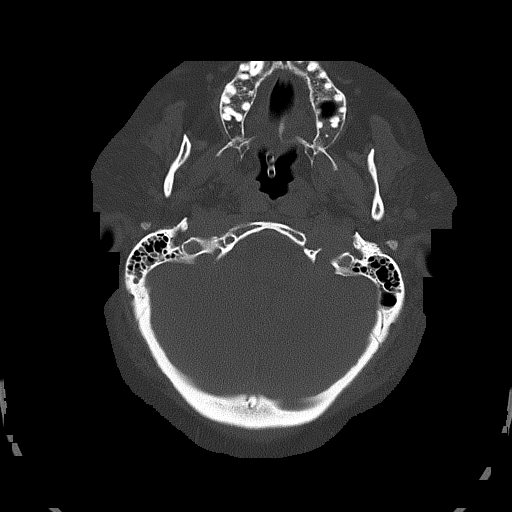

[Series 4: head without cor · coronal · non-contrast · 0.34mm/px · 3 of 67 slices shown]
[im 23/67  brain]
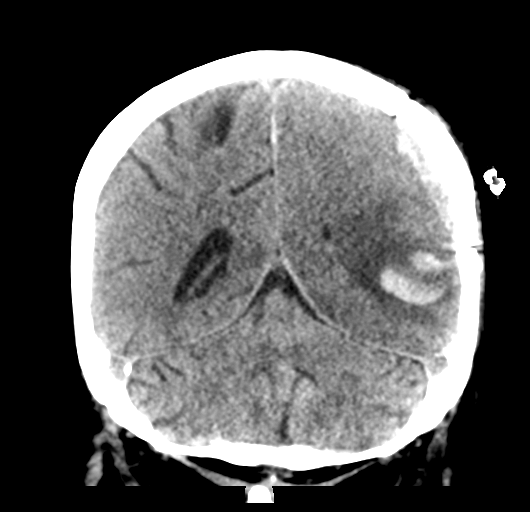
[im 30/67  brain]
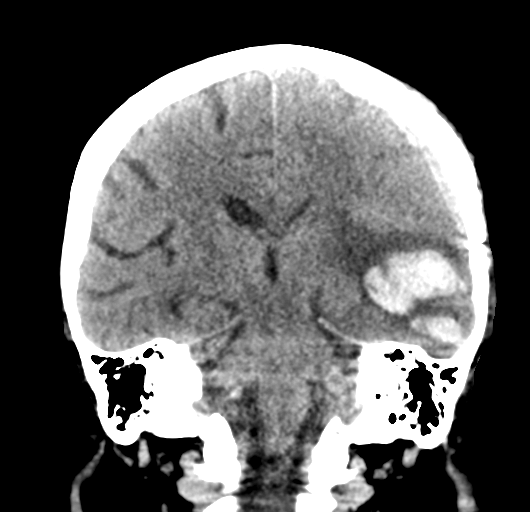
[im 37/67  brain]
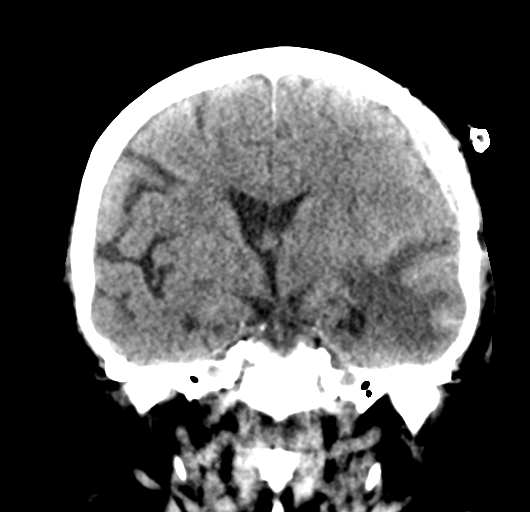

[Series 5: head without sag · sagittal · non-contrast · 0.34mm/px · 3 of 59 slices shown]
[im 20/59  brain]
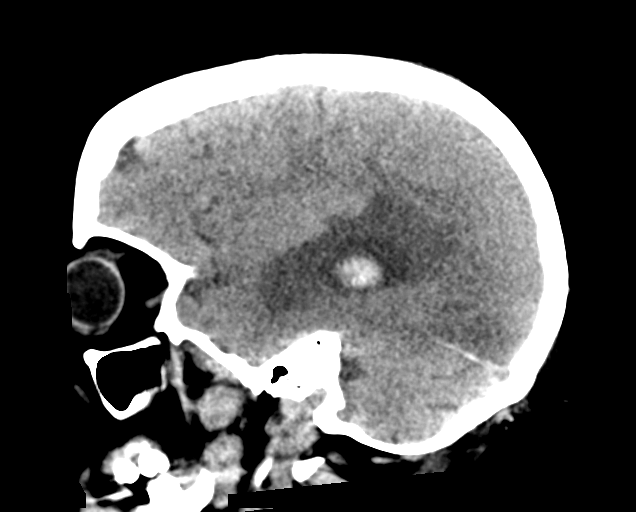
[im 30/59  brain]
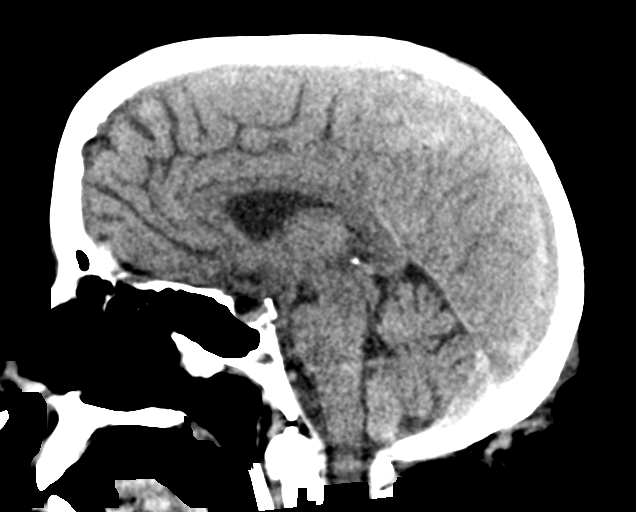
[im 39/59  brain]
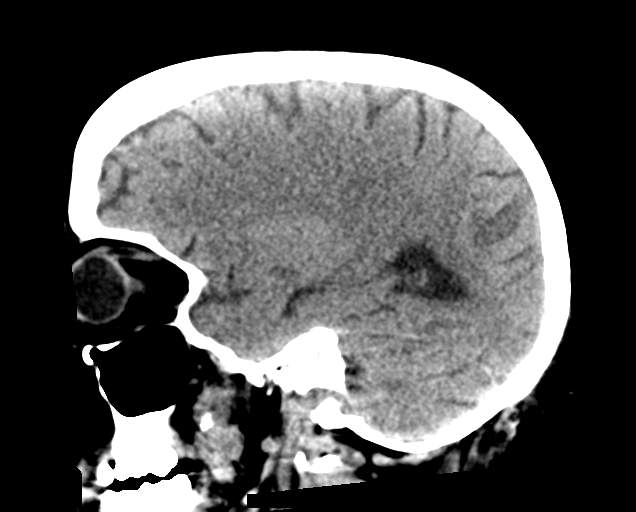

[15 of 47 positions shown; findings below may reference images not displayed]

FINDINGS: Brain: Postoperative changes from left-sided craniotomy for subdural
evacuation again seen. Residual left extra-axial hematoma overlying
the subjacent left cerebral convexity measures up to 8 mm in maximal
thickness, improved from previous. Small amount of subdural blood
seen along the posterior falx. Small amount of residual extra-axial
pneumocephalus, markedly decreased from prior.

Underlying intraparenchymal hematoma centered at the left temporal
lobe has mildly dispersed as compared to previous exam, now
measuring slightly larger in size at 3.5 x 4.2 x 2.1 cm (previously
3.1 x 4.2 x 2.2 cm when measured at similar dimensions. Surrounding
low-density vasogenic edema has slightly increased. Adjacent small
volume subarachnoid hemorrhage, similar. Persistent 4 mm
left-to-right shift, unchanged. Overall ventricular size and
morphology relatively unchanged without hydrocephalus or progressive
ventricular trapping. Basilar cisterns remain patent.

Evolving cytotoxic edema seen involving the parasagittal left
occipital lobe (series 2, image 13), compatible with evolving left
PCA territory infarct, more prominent as compared to previous exam.
No associated hemorrhage or mass effect.

No other new acute intracranial hemorrhage. No other acute large
vessel territory infarct. No mass lesion.

Vascular: No hyperdense vessel.

Skull: Postoperative changes from prior left craniotomy. No adverse
features.

Sinuses/Orbits: Globes and orbital soft tissues demonstrate no acute
finding. Paranasal sinuses are largely clear. Chronic left orbital
floor fracture noted. Nasogastric tube in place. Trace bilateral
mastoid effusions.

Other: None.
IMPRESSION: 1. Slight interval dispersion of left temporal lobe intraparenchymal
hematoma, now measuring slightly increased in size at 3.5 x 4.2 x
2.1 cm (previously 3.1 x 4.2 x 2.2 cm). Surrounding vasogenic edema
has slightly increased. Associated 4 mm left-to-right midline shift
unchanged.
2. Interval decrease in size of left extra-axial hemorrhage status
post left craniotomy for subdural evacuation, now measuring up to 8
mm in maximal thickness.
3. Evolving acute left PCA territory infarct. No associated
hemorrhage or mass effect.
4. No other new acute intracranial process.

## 2019-11-01 IMAGING — DX PORTABLE CHEST - 1 VIEW
1 series · 1 of 1 positions shown · non-contrast
Comparison: One-view chest x-ray 02/13/2019

CLINICAL DATA: Acute respiratory failure.

EXAM:
PORTABLE CHEST 1 VIEW

[chest ap]
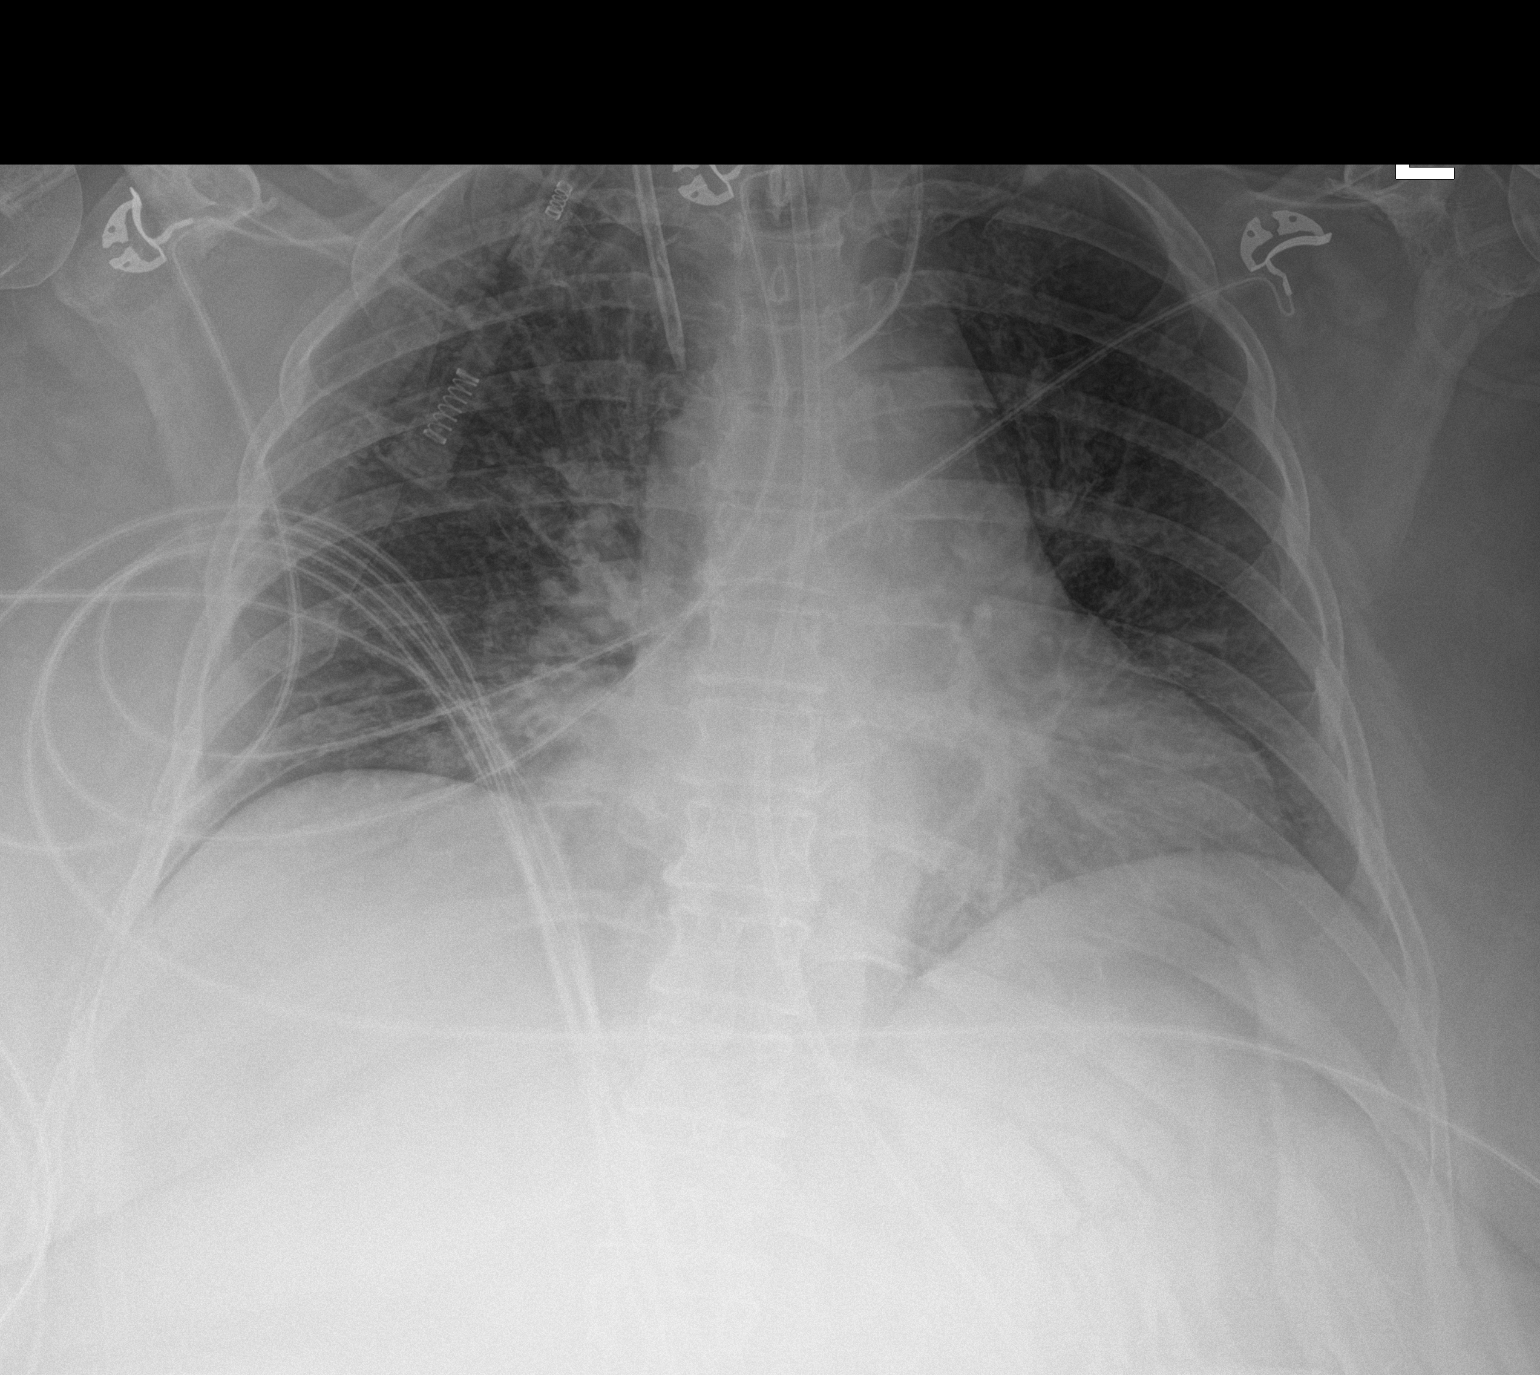

[1 of 1 positions shown; findings below may reference images not displayed]

FINDINGS: Endotracheal tube and bilateral IJ lines are stable. Feeding tube
courses off the inferior border the film. Heart size is exaggerated
by low lung volumes. There is no significant change in bilateral
infrahilar airspace opacities, right greater than left.
IMPRESSION: 1. Stable infrahilar airspace opacities bilaterally, likely
reflecting atelectasis. Infection is not excluded.
2. Support apparatus is stable.
